# Patient Record
Sex: Female | Born: 1950 | Race: White | Hispanic: No | Smoking: Current every day smoker
Health system: Southern US, Community
[De-identification: ages and names within clinical notes are randomized; demographics above are authoritative.]

## PROBLEM LIST (undated history)

## (undated) DIAGNOSIS — I10 Essential (primary) hypertension: Secondary | ICD-10-CM

## (undated) DIAGNOSIS — R634 Abnormal weight loss: Secondary | ICD-10-CM

## (undated) DIAGNOSIS — Z720 Tobacco use: Secondary | ICD-10-CM

## (undated) DIAGNOSIS — I341 Nonrheumatic mitral (valve) prolapse: Secondary | ICD-10-CM

## (undated) DIAGNOSIS — E559 Vitamin D deficiency, unspecified: Secondary | ICD-10-CM

## (undated) DIAGNOSIS — E039 Hypothyroidism, unspecified: Secondary | ICD-10-CM

## (undated) DIAGNOSIS — E785 Hyperlipidemia, unspecified: Secondary | ICD-10-CM

## (undated) DIAGNOSIS — K859 Acute pancreatitis without necrosis or infection, unspecified: Secondary | ICD-10-CM

## (undated) DIAGNOSIS — M797 Fibromyalgia: Secondary | ICD-10-CM

## (undated) DIAGNOSIS — Z716 Tobacco abuse counseling: Secondary | ICD-10-CM

## (undated) HISTORY — DX: Abnormal weight loss: R63.4

## (undated) HISTORY — DX: Tobacco use: Z72.0

## (undated) HISTORY — DX: Essential (primary) hypertension: I10

## (undated) HISTORY — DX: Hyperlipidemia, unspecified: E78.5

## (undated) HISTORY — PX: ABDOMINAL HYSTERECTOMY: SHX81

## (undated) HISTORY — DX: Vitamin D deficiency, unspecified: E55.9

## (undated) HISTORY — PX: LUMBAR DISC SURGERY: SHX700

## (undated) HISTORY — DX: Acute pancreatitis without necrosis or infection, unspecified: K85.90

## (undated) HISTORY — DX: Hypothyroidism, unspecified: E03.9

## (undated) HISTORY — DX: Nonrheumatic mitral (valve) prolapse: I34.1

## (undated) HISTORY — DX: Fibromyalgia: M79.7

## (undated) HISTORY — DX: Tobacco abuse counseling: Z71.6

---

## 1997-05-21 ENCOUNTER — Inpatient Hospital Stay (HOSPITAL_COMMUNITY): Admission: RE | Admit: 1997-05-21 | Discharge: 1997-05-25 | Payer: Self-pay | Admitting: Neurological Surgery

## 1997-07-02 ENCOUNTER — Emergency Department (HOSPITAL_COMMUNITY): Admission: EM | Admit: 1997-07-02 | Discharge: 1997-07-02 | Payer: Self-pay | Admitting: Emergency Medicine

## 1997-09-24 ENCOUNTER — Emergency Department (HOSPITAL_COMMUNITY): Admission: EM | Admit: 1997-09-24 | Discharge: 1997-09-24 | Payer: Self-pay | Admitting: Emergency Medicine

## 1998-01-04 ENCOUNTER — Emergency Department (HOSPITAL_COMMUNITY): Admission: EM | Admit: 1998-01-04 | Discharge: 1998-01-04 | Payer: Self-pay | Admitting: Emergency Medicine

## 1998-08-26 ENCOUNTER — Other Ambulatory Visit: Admission: RE | Admit: 1998-08-26 | Discharge: 1998-08-26 | Payer: Self-pay | Admitting: Family Medicine

## 2000-07-13 ENCOUNTER — Encounter: Payer: Self-pay | Admitting: Emergency Medicine

## 2000-07-13 ENCOUNTER — Inpatient Hospital Stay (HOSPITAL_COMMUNITY): Admission: EM | Admit: 2000-07-13 | Discharge: 2000-07-14 | Payer: Self-pay | Admitting: Emergency Medicine

## 2001-02-17 ENCOUNTER — Emergency Department (HOSPITAL_COMMUNITY): Admission: EM | Admit: 2001-02-17 | Discharge: 2001-02-17 | Payer: Self-pay | Admitting: Emergency Medicine

## 2001-09-02 ENCOUNTER — Emergency Department (HOSPITAL_COMMUNITY): Admission: EM | Admit: 2001-09-02 | Discharge: 2001-09-03 | Payer: Self-pay | Admitting: Emergency Medicine

## 2001-09-03 ENCOUNTER — Encounter: Payer: Self-pay | Admitting: Emergency Medicine

## 2002-05-06 ENCOUNTER — Emergency Department (HOSPITAL_COMMUNITY): Admission: EM | Admit: 2002-05-06 | Discharge: 2002-05-06 | Payer: Self-pay | Admitting: Emergency Medicine

## 2002-09-22 ENCOUNTER — Encounter: Admission: RE | Admit: 2002-09-22 | Discharge: 2002-09-22 | Payer: Self-pay | Admitting: Emergency Medicine

## 2002-09-22 ENCOUNTER — Encounter: Payer: Self-pay | Admitting: Emergency Medicine

## 2002-10-13 ENCOUNTER — Encounter: Admission: RE | Admit: 2002-10-13 | Discharge: 2002-10-13 | Payer: Self-pay | Admitting: Emergency Medicine

## 2002-10-13 ENCOUNTER — Encounter: Payer: Self-pay | Admitting: Radiology

## 2002-10-13 ENCOUNTER — Encounter: Payer: Self-pay | Admitting: Emergency Medicine

## 2002-10-27 ENCOUNTER — Encounter: Payer: Self-pay | Admitting: Emergency Medicine

## 2002-10-27 ENCOUNTER — Encounter: Admission: RE | Admit: 2002-10-27 | Discharge: 2002-10-27 | Payer: Self-pay | Admitting: Emergency Medicine

## 2002-11-25 ENCOUNTER — Encounter: Admission: RE | Admit: 2002-11-25 | Discharge: 2002-11-25 | Payer: Self-pay | Admitting: Neurological Surgery

## 2002-12-02 ENCOUNTER — Encounter: Admission: RE | Admit: 2002-12-02 | Discharge: 2002-12-02 | Payer: Self-pay | Admitting: Neurological Surgery

## 2003-02-09 ENCOUNTER — Inpatient Hospital Stay (HOSPITAL_COMMUNITY): Admission: RE | Admit: 2003-02-09 | Discharge: 2003-02-13 | Payer: Self-pay | Admitting: Neurological Surgery

## 2003-02-20 ENCOUNTER — Emergency Department (HOSPITAL_COMMUNITY): Admission: EM | Admit: 2003-02-20 | Discharge: 2003-02-21 | Payer: Self-pay | Admitting: Emergency Medicine

## 2003-07-20 ENCOUNTER — Emergency Department (HOSPITAL_COMMUNITY): Admission: EM | Admit: 2003-07-20 | Discharge: 2003-07-20 | Payer: Self-pay | Admitting: *Deleted

## 2005-05-31 ENCOUNTER — Encounter: Payer: Self-pay | Admitting: Emergency Medicine

## 2006-05-05 ENCOUNTER — Encounter: Admission: RE | Admit: 2006-05-05 | Discharge: 2006-05-05 | Payer: Self-pay | Admitting: Emergency Medicine

## 2006-05-18 ENCOUNTER — Encounter: Admission: RE | Admit: 2006-05-18 | Discharge: 2006-05-18 | Payer: Self-pay | Admitting: Emergency Medicine

## 2007-12-24 ENCOUNTER — Emergency Department (HOSPITAL_COMMUNITY): Admission: EM | Admit: 2007-12-24 | Discharge: 2007-12-24 | Payer: Self-pay | Admitting: Emergency Medicine

## 2009-04-26 ENCOUNTER — Ambulatory Visit: Payer: Self-pay | Admitting: Cardiovascular Disease

## 2009-04-26 ENCOUNTER — Inpatient Hospital Stay (HOSPITAL_COMMUNITY): Admission: EM | Admit: 2009-04-26 | Discharge: 2009-04-30 | Payer: Self-pay | Admitting: Emergency Medicine

## 2009-04-30 ENCOUNTER — Encounter (INDEPENDENT_AMBULATORY_CARE_PROVIDER_SITE_OTHER): Payer: Self-pay | Admitting: Internal Medicine

## 2009-08-09 ENCOUNTER — Emergency Department (HOSPITAL_COMMUNITY): Admission: EM | Admit: 2009-08-09 | Discharge: 2009-08-09 | Payer: Self-pay | Admitting: Family Medicine

## 2009-11-10 ENCOUNTER — Emergency Department (HOSPITAL_COMMUNITY): Admission: EM | Admit: 2009-11-10 | Discharge: 2009-11-10 | Payer: Self-pay | Admitting: Emergency Medicine

## 2010-02-20 ENCOUNTER — Encounter: Payer: Self-pay | Admitting: Emergency Medicine

## 2010-04-13 LAB — DIFFERENTIAL
Basophils Absolute: 0 10*3/uL (ref 0.0–0.1)
Lymphocytes Relative: 25 % (ref 12–46)
Monocytes Absolute: 0.9 10*3/uL (ref 0.1–1.0)
Neutro Abs: 5.9 10*3/uL (ref 1.7–7.7)

## 2010-04-13 LAB — BASIC METABOLIC PANEL
BUN: 5 mg/dL — ABNORMAL LOW (ref 6–23)
GFR calc Af Amer: >60 mL/min (ref >60)
GFR calc non Af Amer: >60 mL/min (ref >60)
Potassium: 3 mEq/L — ABNORMAL LOW (ref 3.5–5.1)
Sodium: 139 mEq/L (ref 135–145)

## 2010-04-13 LAB — POCT CARDIAC MARKERS: Myoglobin, poc: 43 ng/mL (ref 12–200)

## 2010-04-13 LAB — D-DIMER, QUANTITATIVE: D-Dimer, Quant: 0.59 ug/mL-FEU — ABNORMAL HIGH (ref 0.00–0.48)

## 2010-04-13 LAB — CBC
HCT: 39.3 % (ref 36.0–46.0)
Platelets: 223 10*3/uL (ref 150–400)
RBC: 4.4 MIL/uL (ref 3.87–5.11)
RDW: 14.4 % (ref 11.5–15.5)
WBC: 9.2 10*3/uL (ref 4.0–10.5)

## 2010-04-20 LAB — COMPREHENSIVE METABOLIC PANEL
CO2: 18 mEq/L — ABNORMAL LOW (ref 19–32)
Calcium: 9.9 mg/dL (ref 8.4–10.5)
Creatinine, Ser: 1.33 mg/dL — ABNORMAL HIGH (ref 0.4–1.2)
GFR calc non Af Amer: 41 mL/min — ABNORMAL LOW (ref >60)
Glucose, Bld: 72 mg/dL (ref 70–99)

## 2010-04-24 LAB — COMPREHENSIVE METABOLIC PANEL
AST: 43 U/L — ABNORMAL HIGH (ref 0–37)
Albumin: 3.3 g/dL — ABNORMAL LOW (ref 3.5–5.2)
Alkaline Phosphatase: 40 U/L (ref 39–117)
BUN: 19 mg/dL (ref 6–23)
BUN: 27 mg/dL — ABNORMAL HIGH (ref 6–23)
Calcium: 14.3 mg/dL (ref 8.4–10.5)
Chloride: 113 mEq/L — ABNORMAL HIGH (ref 96–112)
Creatinine, Ser: 1.55 mg/dL — ABNORMAL HIGH (ref 0.4–1.2)
Creatinine, Ser: 1.7 mg/dL — ABNORMAL HIGH (ref 0.4–1.2)
GFR calc Af Amer: 37 mL/min — ABNORMAL LOW (ref >60)
Glucose, Bld: 88 mg/dL (ref 70–99)
Potassium: 3.1 mEq/L — ABNORMAL LOW (ref 3.5–5.1)
Total Bilirubin: 0.5 mg/dL (ref 0.3–1.2)

## 2010-04-24 LAB — URINE MICROSCOPIC-ADD ON

## 2010-04-24 LAB — URINALYSIS, ROUTINE W REFLEX MICROSCOPIC
Glucose, UA: NEGATIVE mg/dL
Hgb urine dipstick: NEGATIVE
Ketones, ur: NEGATIVE mg/dL
Protein, ur: NEGATIVE mg/dL

## 2010-04-24 LAB — CARDIAC PANEL(CRET KIN+CKTOT+MB+TROPI)
CK, MB: 3.9 ng/mL (ref 0.3–4.0)
Relative Index: 3.1 — ABNORMAL HIGH (ref 0.0–2.5)
Total CK: 127 U/L (ref 7–177)
Total CK: 232 U/L — ABNORMAL HIGH (ref 7–177)
Troponin I: 0.01 ng/mL (ref 0.00–0.06)
Troponin I: 0.02 ng/mL (ref 0.00–0.06)

## 2010-04-24 LAB — BASIC METABOLIC PANEL
BUN: 23 mg/dL (ref 6–23)
Calcium: 10.3 mg/dL (ref 8.4–10.5)
Calcium: 12.8 mg/dL — ABNORMAL HIGH (ref 8.4–10.5)
Chloride: 101 mEq/L (ref 96–112)
Creatinine, Ser: 1.44 mg/dL — ABNORMAL HIGH (ref 0.4–1.2)
Creatinine, Ser: 1.59 mg/dL — ABNORMAL HIGH (ref 0.4–1.2)
GFR calc Af Amer: 40 mL/min — ABNORMAL LOW (ref >60)
GFR calc Af Amer: 43 mL/min — ABNORMAL LOW (ref >60)
GFR calc Af Amer: 45 mL/min — ABNORMAL LOW (ref >60)
GFR calc non Af Amer: 33 mL/min — ABNORMAL LOW (ref >60)
GFR calc non Af Amer: 35 mL/min — ABNORMAL LOW (ref >60)
GFR calc non Af Amer: 37 mL/min — ABNORMAL LOW (ref >60)
Potassium: 2.7 mEq/L — CL (ref 3.5–5.1)
Potassium: 3.1 mEq/L — ABNORMAL LOW (ref 3.5–5.1)
Sodium: 137 mEq/L (ref 135–145)

## 2010-04-24 LAB — URINALYSIS, MICROSCOPIC ONLY
Bilirubin Urine: NEGATIVE
Hgb urine dipstick: NEGATIVE
Ketones, ur: NEGATIVE mg/dL
Nitrite: NEGATIVE
Specific Gravity, Urine: 1.003 — ABNORMAL LOW (ref 1.005–1.030)
pH: 6.5 (ref 5.0–8.0)

## 2010-04-24 LAB — CBC
HCT: 37.8 % (ref 36.0–46.0)
Hemoglobin: 11.1 g/dL — ABNORMAL LOW (ref 12.0–15.0)
Hemoglobin: 13.1 g/dL (ref 12.0–15.0)
MCHC: 34.2 g/dL (ref 30.0–36.0)
MCHC: 34.6 g/dL (ref 30.0–36.0)
MCV: 93.3 fL (ref 78.0–100.0)
Platelets: 209 10*3/uL (ref 150–400)
RDW: 12.7 % (ref 11.5–15.5)
RDW: 12.8 % (ref 11.5–15.5)

## 2010-04-24 LAB — HEPATIC FUNCTION PANEL
ALT: 20 U/L (ref 0–35)
AST: 37 U/L (ref 0–37)
Alkaline Phosphatase: 41 U/L (ref 39–117)
Bilirubin, Direct: <0.1 mg/dL (ref 0.0–0.3)
Total Protein: 5.8 g/dL — ABNORMAL LOW (ref 6.0–8.3)

## 2010-04-24 LAB — CK TOTAL AND CKMB (NOT AT ARMC)
CK, MB: 7.3 ng/mL (ref 0.3–4.0)
Relative Index: 2.6 — ABNORMAL HIGH (ref 0.0–2.5)

## 2010-04-24 LAB — LIPID PANEL
LDL Cholesterol: 64 mg/dL (ref 0–99)
Triglycerides: 118 mg/dL (ref <150)
VLDL: 24 mg/dL (ref 0–40)

## 2010-04-24 LAB — CULTURE, BLOOD (ROUTINE X 2)

## 2010-04-24 LAB — UIFE/LIGHT CHAINS/TP QN, 24-HR UR
Free Kappa Lt Chains,Ur: 1.2 mg/dL (ref 0.04–1.51)
Free Lambda Lt Chains,Ur: 0.11 mg/dL (ref 0.08–1.01)
Gamma Globulin, Urine: DETECTED — AB

## 2010-04-24 LAB — POCT I-STAT, CHEM 8
BUN: 31 mg/dL — ABNORMAL HIGH (ref 6–23)
Chloride: 97 mEq/L (ref 96–112)
Creatinine, Ser: 1.9 mg/dL — ABNORMAL HIGH (ref 0.4–1.2)
Potassium: 2 mEq/L — CL (ref 3.5–5.1)
Sodium: 134 mEq/L — ABNORMAL LOW (ref 135–145)

## 2010-04-24 LAB — DIFFERENTIAL
Basophils Absolute: 0 10*3/uL (ref 0.0–0.1)
Basophils Relative: 0 % (ref 0–1)
Eosinophils Absolute: 0.1 10*3/uL (ref 0.0–0.7)
Eosinophils Relative: 1 % (ref 0–5)
Monocytes Absolute: 1 10*3/uL (ref 0.1–1.0)
Neutro Abs: 8.3 10*3/uL — ABNORMAL HIGH (ref 1.7–7.7)

## 2010-04-24 LAB — MAGNESIUM
Magnesium: 1.2 mg/dL — ABNORMAL LOW (ref 1.5–2.5)
Magnesium: 1.3 mg/dL — ABNORMAL LOW (ref 1.5–2.5)

## 2010-04-24 LAB — CREATININE, URINE, RANDOM: Creatinine, Urine: 20.4 mg/dL

## 2010-04-24 LAB — LIPASE, BLOOD
Lipase: 297 U/L — ABNORMAL HIGH (ref 11–59)
Lipase: 54 U/L (ref 11–59)

## 2010-04-24 LAB — PTH-RELATED PEPTIDE

## 2010-04-24 LAB — PROTEIN ELECTROPH W RFLX QUANT IMMUNOGLOBULINS
Albumin ELP: 53.6 % — ABNORMAL LOW (ref 55.8–66.1)
Alpha-1-Globulin: 6.8 % — ABNORMAL HIGH (ref 2.9–4.9)
Beta Globulin: 6.3 % (ref 4.7–7.2)
Total Protein ELP: 6.1 g/dL (ref 6.0–8.3)

## 2010-04-24 LAB — MRSA PCR SCREENING: MRSA by PCR: NEGATIVE

## 2010-04-24 LAB — URINE CULTURE: Culture: NO GROWTH

## 2010-04-24 LAB — TROPONIN I: Troponin I: 0.03 ng/mL (ref 0.00–0.06)

## 2010-04-24 LAB — PTH, INTACT AND CALCIUM: PTH: 7.1 pg/mL — ABNORMAL LOW (ref 14.0–72.0)

## 2010-04-24 LAB — POTASSIUM: Potassium: 2.2 mEq/L — CL (ref 3.5–5.1)

## 2010-04-24 LAB — CALCIUM: Calcium: 14.3 mg/dL (ref 8.4–10.5)

## 2010-04-29 DIAGNOSIS — M546 Pain in thoracic spine: Secondary | ICD-10-CM

## 2010-04-29 DIAGNOSIS — M791 Myalgia, unspecified site: Secondary | ICD-10-CM

## 2010-04-29 HISTORY — DX: Myalgia, unspecified site: M79.10

## 2010-04-29 HISTORY — DX: Pain in thoracic spine: M54.6

## 2010-06-17 NOTE — Op Note (Signed)
Beth Blake, Beth Blake                            ACCOUNT NO.:  [id number]   MEDICAL RECORD NO.:  [id number]                   PATIENT TYPE:  INP   LOCATION:  2899                                 FACILITY:  [facility]   PHYSICIAN:  Stefani Dama, M.D.               DATE OF BIRTH:  10-16-1950   DATE OF PROCEDURE:  02/09/2003  DATE OF DISCHARGE:                                 OPERATIVE REPORT   PREOPERATIVE DIAGNOSIS:  Lumbar spondylosis and stenosis, L3-L4, status post  lumbar  arthrodesis L4 to sacrum. Lumbar radiculopathy.   POSTOPERATIVE DIAGNOSIS:  Lumbar spondylosis and stenosis, L3-L4, status  post lumbar  arthrodesis L4 to sacrum. Lumbar radiculopathy.   PROCEDURE:  1. Laminectomy L3-L4 bilaterally.  2. Posterior lumbar  interbody arthrodesis L3-4 with PEAK Synthes interbody     spacers, local autograft and allograft.  3. Posterolateral arthrodesis  with local autograft  and allograft     nonsegmental fixation with pedicle screws L3-L4.   SURGEON:  Stefani Dama, M.D.   FIRST ASSISTANT:  Hewitt Shorts, M.D.   ANESTHESIA:  General endotracheal anesthesia.   INDICATIONS FOR PROCEDURE:  The patient is a 60 year old individual who has  had significant difficulties with her back in the past. She had  initially  undergone decompression and arthrodesis  at L4-L5. She recovered well and  was able to return to work. A few years later she developed problems at L5-  S1 and underwent a Ray cage arthrodesis and recovered well from  that  procedure. She was able to return to work. The patient has done well for  the past 5 or 6 years and now presents with new symptoms  of back pain,  bilateral leg pain with evidence of a spondylitic stenosis at the level of  L3-4 above her previous surgeries. She has been advised regarding surgical  decompression and stabilization.   DESCRIPTION OF PROCEDURE:  The patient was brought to the operating room  supine on the stretcher. After the smooth  induction of general endotracheal  anesthesia she was turned prone. Her back was shaved and prepped with  Duraprep and draped in a sterile fashion.   A midline incision was made in the lumbar spine and carried up just above  the end of her old incision. Dissection was then taken down to the  lumbodorsal fascia which was opened on either  side of the midline. The  intralaminar space at L3-4 was identified by identifying the spinous  processes of L3 positively with a radiograph. The intralaminar space was  then cleared bilaterally at L3-4. The dissection was taken out over the  facet joints which were noted to be significantly hypertrophied at L3-4.   The L4 transverse process was identified and superiorly the L3 transverse  process was identified. These lateral gutters were then packed off for later  use. A laminectomy was then created removing the inferior  margin of the  lamina of L3 including  the entirety of the facet joint. The spinous process  and superior portion of the laminar arch was left intact.   The common dural tube was decompressed. There was a significant amount of  hypertrophied ligamentum and overgrown bone. With this being taken down, the  common dural tube was decompressed as was the L4 nerve root inferiorly.  Superiorly the L3 nerve roots were decompressed.   The disk space was identified and this was isolated. The disk was noted to  be very soft and bulging posteriorly. This was first done on the right side  and then on the left. Then from the right  side a diskectomy was performed  and a significant quantity of severely degenerated disk material was removed  after opening the posterior longitudinal ligament  with a #15 blade.   Once this area was decompressed, a series of curets and rongeurs were used  to evacuate the remnants of the significantly degenerated disk. A self-  retaining disk spreader was placed in the wound and a diskectomy was then  performed on  the opposite side with the distraction of the disk space. A  series of curets and rongeurs was passed down this side and the disk was  removed to the region of the anterior longitudinal ligament. The lateral  gutters were decompressed in a similar fashion.   Then having removed all the disk, the endplates were prepared by curettage  and using a raspatory. They were sized then for an 11-mm spacer. An 11-mm  PEAK spacer was then packed with the patient's autologous bone and some  allograft bone which was mixed with a platelet gel  concentrate. This was  packed into  the PEAK spacers and then the PEAK spacers were inserted after  placing bone graft into the disk space and packing it anteriorly  and  laterally. First this was done on the right and then on the left.   The pedicle entry sites were then chosen at L3 and L4. These were checked  radiographically, and then four 6.2 x 40 mm screws were placed into each of  the holes after tapping them  sequentially and then checking for cutout. No  cutout was found. The screws thus being placed, the lateral gutters that had  been previously packed off were then packed with the patient's cancellous  bone and the bone mixed with platelet gel.   The screw caps were applied and the system was locked into position  in the  neutral position, not applying any compression or distraction forces. A  final localizing radiograph was obtained and this identified  good position  of the surgical construct. The wound was copiously irrigated with antibiotic  irrigating solution and the lumbodorsal fascia was closed with #1 Vicryl in  an interrupted fashion. Then 2-0 Vicryl was used in the subcutaneous tissues  and 3-0 Vicryl subcuticularly.   The patient tolerated the procedure well. She was returned to the recovery  room in stable condition. Blood loss for the case was estimated at 200 mL.  No Cellsaver blood was prepared.                                               Stefani Dama, M.D.    Merla Riches  D:  02/09/2003  T:  02/09/2003  Job:  [id number] 

## 2010-06-17 NOTE — Discharge Summary (Signed)
Beth Blake, Beth Blake                            ACCOUNT NO.:  [id number]   MEDICAL RECORD NO.:  [id number]                   PATIENT TYPE:  INP   LOCATION:  3018                                 FACILITY:  [facility]   PHYSICIAN:  Stefani Dama, M.D.               DATE OF BIRTH:  1950-12-27   DATE OF ADMISSION:  02/09/2003  DATE OF DISCHARGE:  02/13/2003                                 DISCHARGE SUMMARY   ADMISSION DIAGNOSIS:  Lumbar spondylosis and stenosis, L3-4, with lumbar  radiculopathy.   DISCHARGE DIAGNOSIS:  1. Lumbar spondylosis and stenosis, L3-4, with lumbar radiculopathy.  2  Paralytic ileus postoperatively.  1. Urinary tract infection.   HOSPITAL COURSE:  [name] is a 61 year old individual who has had a  previous decompression and fusion at L4-5.  She had developed increasing  back pain with lower lumbar radiculopathy and was found to have severe  spondylitic stenosis at L3-4, a junctional syndrome.  She was advised  regarding surgical decompression and stabilization and was admitted to the  hospital on February 09, 2003 for this procedure.  She tolerated the surgery  well and was gradually mobilized; however, she had difficulty with  persistent nausea secondary to a paralytic ileus.  This gradually resolved  with the passage of time, decreasing her pain medication.  She is also noted  to have a urinary tract infection with thick, putrid sediment in her urine.  She was started on some oral antibiotics, and this gradually resolved.  She  is mobilized and ambulatory by the fourth postoperative day and was  discharged home.  She will be seen in the office in two weeks time for  further followup.                                                Stefani Dama, M.D.    Beth Blake  D:  02/26/2003  T:  02/27/2003  Job:  [id number]

## 2010-11-01 LAB — BASIC METABOLIC PANEL
BUN: 13
CO2: 27
Chloride: 105
Glucose, Bld: 103 — ABNORMAL HIGH
Potassium: 3.5

## 2010-11-01 LAB — CBC
HCT: 38.1
MCV: 90.9
Platelets: 248
WBC: 7.4

## 2010-11-01 LAB — DIFFERENTIAL
Eosinophils Absolute: 0.2
Eosinophils Relative: 3
Lymphs Abs: 2.8

## 2010-11-01 LAB — T4, FREE: Free T4: 1.41

## 2010-11-11 DIAGNOSIS — M797 Fibromyalgia: Secondary | ICD-10-CM | POA: Insufficient documentation

## 2011-04-05 IMAGING — NM NM LIVER FUNCTION STUDY
2 series · 7 of 7 positions shown · non-contrast
Comparison: CT abdomen 04/26/2009.

CLINICAL DATA: 59-year-old female with gallstones, abdominal pain
and nausea.

NUCLEAR MEDICINE HEPATOBILIARY IMAGING
TECHNIQUE: Sequential images of the abdomen were obtained [DATE] minutes following intravenous administration of
radiopharmaceutical.
Radiopharmaceutical:  5 mCi 1c-99m Choletec

[he hepatobiliary · 3.43mm/px · 6 of 39 frames shown (1 of 2)]
[frame 4/39]
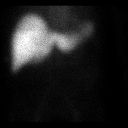
[frame 10/39]
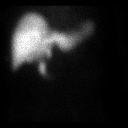
[frame 17/39]
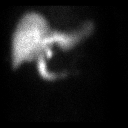
[frame 23/39]
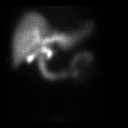
[frame 30/39]
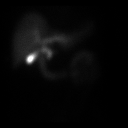
[frame 36/39]
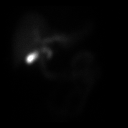

[he hepatobiliary · 1 of 1 slices shown (2 of 2)]
[im 1/1]
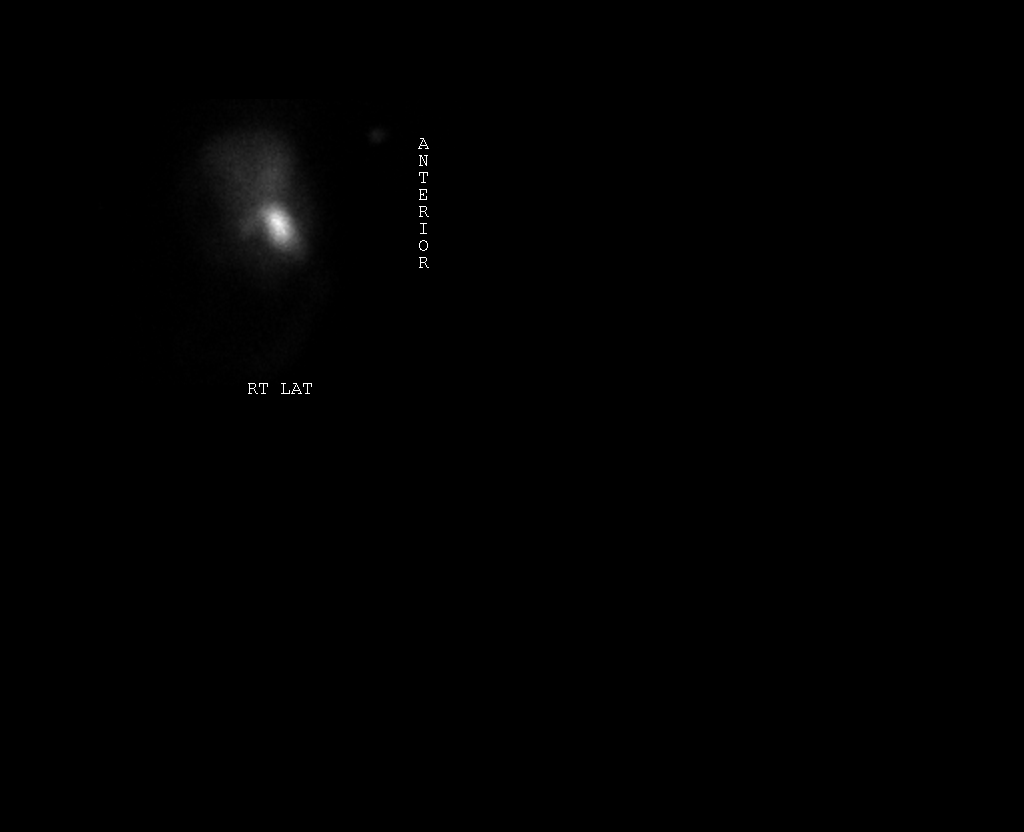

[7 of 7 positions shown; findings below may reference images not displayed]

FINDINGS: Normal radiotracer uptake by the liver and clearance of
the blood pool.  Prompt appearance of the CBD and small bowel by 15
minutes.  Gallbladder is first evident on the 25-minute exam with
increasing gallbladder activity to the conclusion of the study.
Radiotracer activity washes out of the liver and into the small
bowel.
IMPRESSION: Negative.    No evidence of acute cholecystitis.

## 2011-08-07 DIAGNOSIS — E785 Hyperlipidemia, unspecified: Secondary | ICD-10-CM

## 2011-08-07 HISTORY — DX: Hyperlipidemia, unspecified: E78.5

## 2011-10-19 IMAGING — CT CT ANGIO CHEST
1 of 13 series · 12 of 37 positions shown · IV contrast (APPLIED)
Comparison: Noncontrast abdominal pelvic CT 04/26/2009.

CTA CHEST

CLINICAL DATA: Chest pain with elevated D-dimer levels for 2 days.
History of hypertension.

CT ANGIOGRAPHY CHEST
CT ABDOMEN WITH CONTRAST
TECHNIQUE: Multidetector CT imaging of the chest was performed
using the standard protocol during bolus administration of
intravenous contrast.  Multiplanar CT image reconstructions
including MIPs were obtained to evaluate the vascular anatomy.
Multidetector CT imaging of the abdomen and pelvis was performed
intravenous contrast.
Contrast: 100 ml 4mnipaque-JFF intravenously.

[Series 8: pulm embolism 1.0 b25f thins · axial · 0.71mm/px · z∈[+252,+492]mm · 12 of 285 slices shown]
[im 22/285  lung]
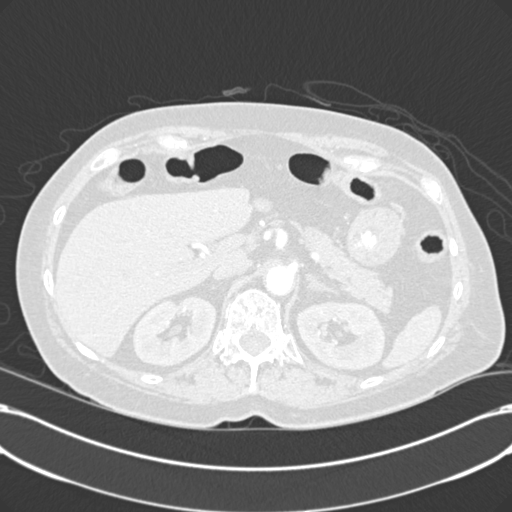
[im 44/285  mediastinal]
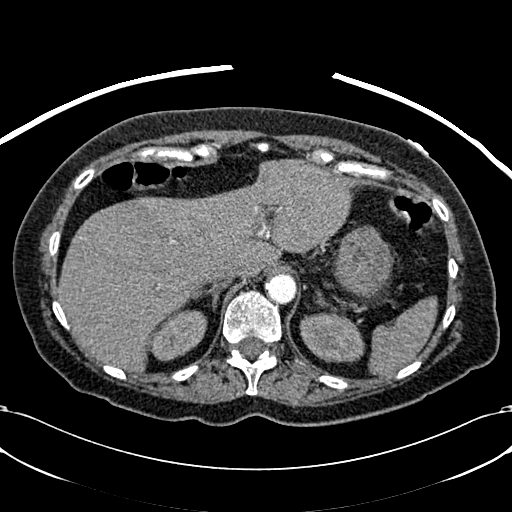
[im 66/285  lung]
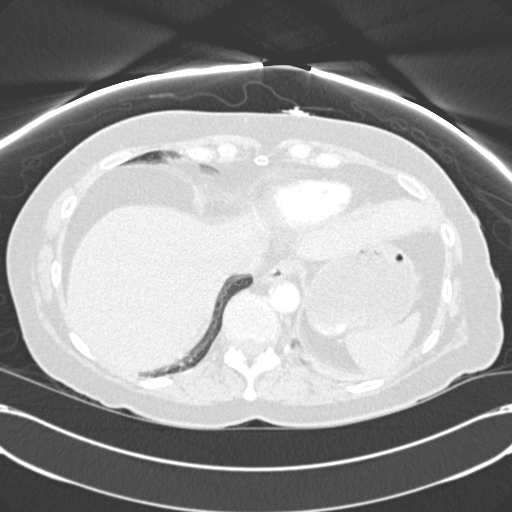
[im 88/285  mediastinal]
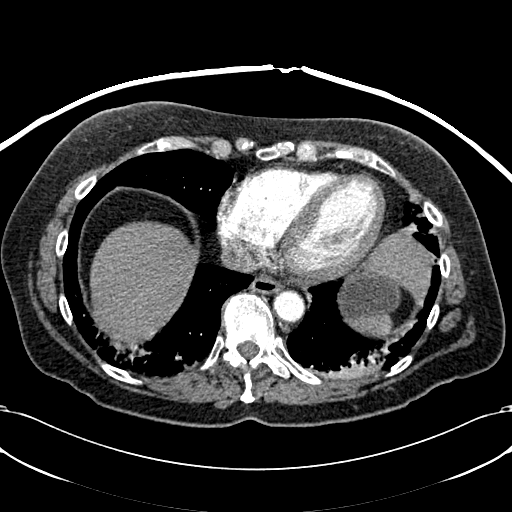
[im 110/285  lung]
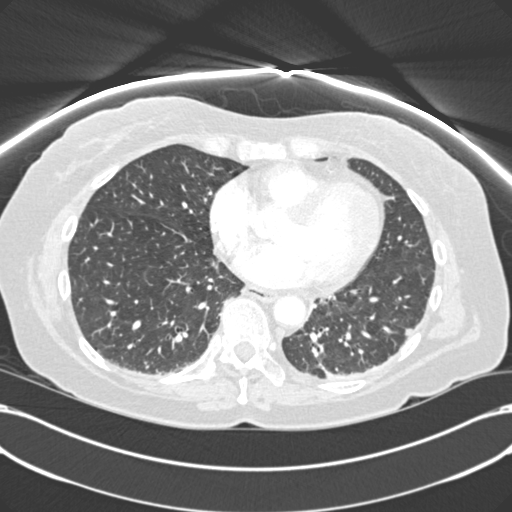
[im 132/285  mediastinal]
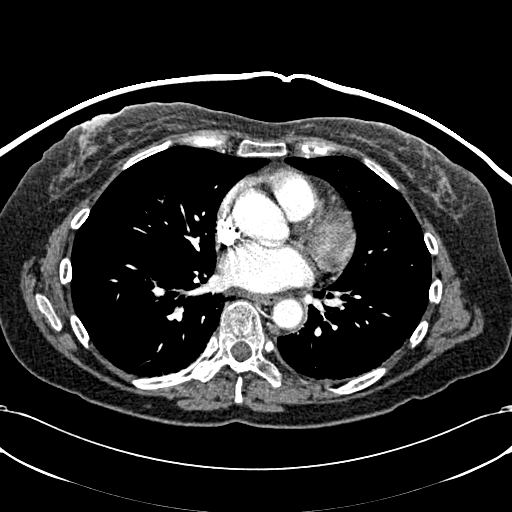
[im 153/285  lung]
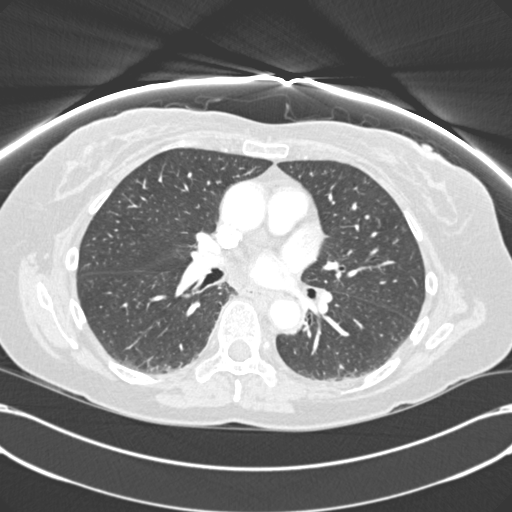
[im 175/285  mediastinal]
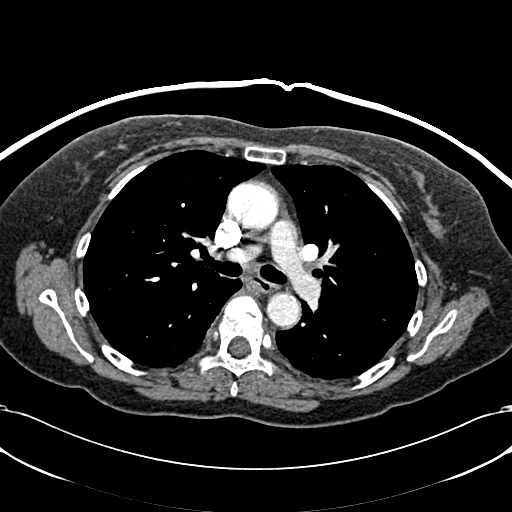
[im 197/285  lung]
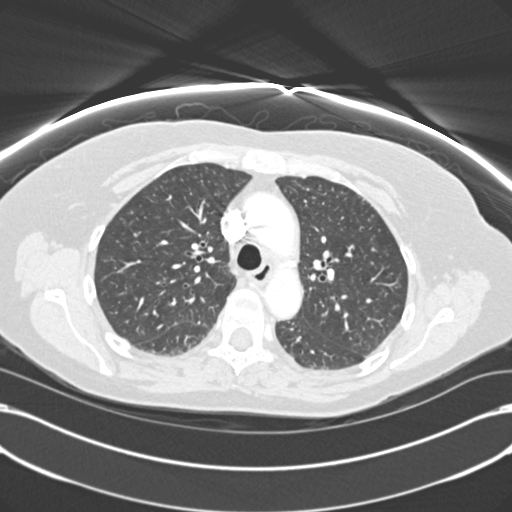
[im 219/285  mediastinal]
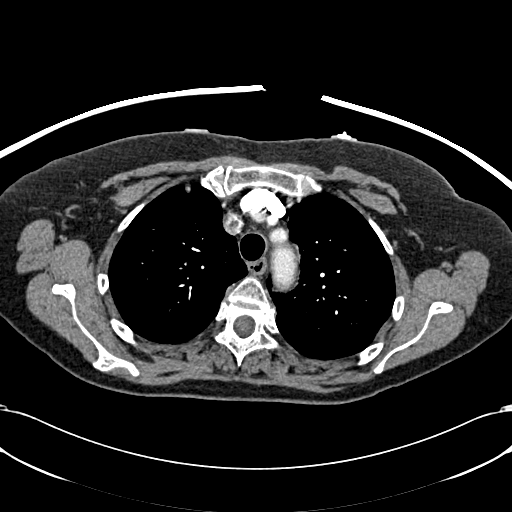
[im 241/285  lung]
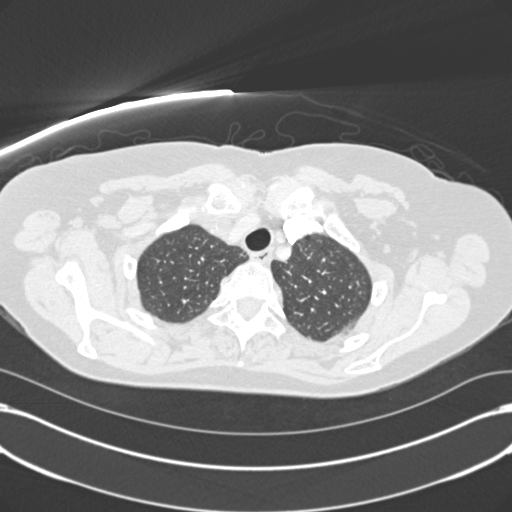
[im 263/285  mediastinal]
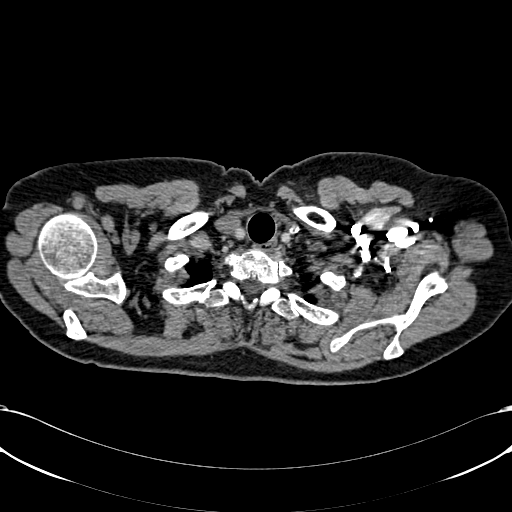

[12 of 37 positions shown; findings below may reference images not displayed]

FINDINGS: The pulmonary arteries are well opacified with contrast.
There is no evidence of acute pulmonary embolism.  The thoracic
aorta demonstrates mild atherosclerosis.  There is no evidence of
aneurysm or dissection.  There is a small hiatal hernia.  No
enlarged mediastinal or hilar lymph nodes are present.

There is no pleural or pericardial effusion.  Dependent opacities
in both lung bases are most consistent with atelectasis.  There is
no confluent airspace opacity.  Small nodular densities are present
in both upper lobes, measuring less than 3 mm in diameter (images
25 and 27).

There are superior endplate compression deformities at T3, T4 and
T6.  There is possibly a small amount of anterior paraspinal
hemorrhage at these levels, and these fractures could be
acute/subacute.  Correlate clinically.

 Review of the MIP images confirms the above findings.
IMPRESSION: 1.  No evidence of acute pulmonary embolism.
2.  Mild atherosclerosis without evidence of dissection or
aneurysm.
3.  Bibasilar atelectasis.  Tiny upper lobe nodules unlikely to be
clinically significant.
4.  Superior endplate compression deformities at T3, T4 and T6,
potentially acute/ subacute.  Correlate clinically.

CT ABDOMEN
FINDINGS: The liver, spleen and pancreas appear normal.  There is a
probable small gallstone or gallbladder polyp, unchanged.  There is
no biliary dilatation.

The adrenal glands and kidneys appear normal.

There is fairly extensive aorto iliac atherosclerosis.  No large
vessel occlusion is identified.  There is no adenopathy.  The
abdominal bowel gas pattern is normal with moderate stool in the
right colon.  There are stable postsurgical changes in the lower
lumbar spine status post L3-S1 fusion.  The pelvis was not imaged.
IMPRESSION: 1.  No acute abdominal findings.
2.  Stable small gallstone or small polyp.
3.  Postsurgical changes in the lower lumbar spine.
4.  Diffuse abdominal aortic atherosclerosis.

## 2012-02-12 DIAGNOSIS — E039 Hypothyroidism, unspecified: Secondary | ICD-10-CM | POA: Insufficient documentation

## 2012-02-12 DIAGNOSIS — S22009A Unspecified fracture of unspecified thoracic vertebra, initial encounter for closed fracture: Secondary | ICD-10-CM

## 2012-02-12 DIAGNOSIS — I10 Essential (primary) hypertension: Secondary | ICD-10-CM | POA: Insufficient documentation

## 2012-02-12 HISTORY — DX: Unspecified fracture of unspecified thoracic vertebra, initial encounter for closed fracture: S22.009A

## 2012-02-12 HISTORY — DX: Essential (primary) hypertension: I10

## 2012-03-25 DIAGNOSIS — M47812 Spondylosis without myelopathy or radiculopathy, cervical region: Secondary | ICD-10-CM

## 2012-03-25 HISTORY — DX: Spondylosis without myelopathy or radiculopathy, cervical region: M47.812

## 2013-08-14 DIAGNOSIS — E559 Vitamin D deficiency, unspecified: Secondary | ICD-10-CM

## 2013-08-14 HISTORY — DX: Vitamin D deficiency, unspecified: E55.9

## 2014-04-06 DIAGNOSIS — Z720 Tobacco use: Secondary | ICD-10-CM | POA: Insufficient documentation

## 2014-04-15 ENCOUNTER — Encounter: Payer: Self-pay | Admitting: Nurse Practitioner

## 2014-04-29 ENCOUNTER — Encounter: Payer: Self-pay | Admitting: *Deleted

## 2014-04-30 ENCOUNTER — Ambulatory Visit (INDEPENDENT_AMBULATORY_CARE_PROVIDER_SITE_OTHER): Payer: Medicare HMO | Admitting: Nurse Practitioner

## 2014-04-30 ENCOUNTER — Encounter: Payer: Self-pay | Admitting: Nurse Practitioner

## 2014-04-30 VITALS — BP 128/76 | HR 80 | Ht 65.5 in | Wt 120.5 lb

## 2014-04-30 DIAGNOSIS — R6881 Early satiety: Secondary | ICD-10-CM | POA: Insufficient documentation

## 2014-04-30 DIAGNOSIS — R634 Abnormal weight loss: Secondary | ICD-10-CM

## 2014-04-30 HISTORY — DX: Early satiety: R68.81

## 2014-04-30 NOTE — Patient Instructions (Addendum)
You have been given a separate informational sheet regarding your tobacco use, the importance of quitting and local resources to help you quit.   You have been scheduled for an endoscopy. Please follow written instructions given to you at your visit today. If you use inhalers (even only as needed), please bring them with you on the day of your procedure. Your physician has requested that you go to www.startemmi.com and enter the access code given to you at your visit today. This web site gives a general overview about your procedure. However, you should still follow specific instructions given to you by our office regarding your preparation for the procedure. 

## 2014-04-30 NOTE — Progress Notes (Signed)
Reviewed and agree.

## 2014-04-30 NOTE — Progress Notes (Addendum)
HPI : Patient is a 64 year old female referred by PCP for evaluation of epigastric pain, early satiety and weight loss. CBC, CMET, and sedimentation rate all normal early March. Chest x-ray shows a mild thoracic compression deformity of indeterminate age and changes related to COPD. CT scan of the abdomen and pelvis with contrast done [facility] 04/13/14 reveals gallstones within a normal-appearing gallbladder, punctate noncalcified nodules in the subpleural aspect of the left lower lobe of the lung was seen.   Patient reports that she was 142 pounds in July, now down in the 120s. She has no nausea. Appetite is okay but has significant early satiety. Patient reports epigastric tenderness but no pain.   Past Medical History  Diagnosis Date  . Hypothyroidism   . Hypertension   . Hyperlipemia   . Tobacco abuse counseling   . Vitamin D deficiency disease   . Weight loss   . Tobacco abuse   . Fibromyalgia   . Pancreatitis   . Mitral valve prolapse     Past Surgical History  Procedure Laterality Date  . Abdominal hysterectomy    . Lumbar disc surgery      x 5    Family History  Problem Relation Age of Onset  . Heart disease Father   . Asthma Sister   . Breast cancer Mother   . Breast cancer Sister     x 2  . Emphysema Paternal Grandfather    History  Substance Use Topics  . Smoking status: Current Every Day Smoker -- 1.00 packs/day for 47 years    Types: Cigarettes  . Smokeless tobacco: Never Used  . Alcohol Use: No   Current Outpatient Prescriptions  Medication Sig Dispense Refill  . amLODipine (NORVASC) 10 MG tablet Take 10 mg by mouth daily. Take 1 tab daily    . atenolol (TENORMIN) 25 MG tablet Take by mouth daily.    Marland Kitchen. levothyroxine (SYNTHROID, LEVOTHROID) 125 MCG tablet Take 125 mcg by mouth daily before breakfast. Take 1 tab daily    . simvastatin (ZOCOR) 20 MG tablet Take 20 mg by mouth daily.     No current facility-administered medications for  this visit.   Allergies  Allergen Reactions  . Meloxicam     Edema  . Sulfa Antibiotics     Swelling   Review of Systems: All systems reviewed and negative except where noted in HPI.   Physical Exam: BP 128/76 mmHg  Pulse 80  Ht 5' 5.5" (1.664 m)  Wt 120 lb 8 oz (54.658 kg)  BMI 19.74 kg/m2 Constitutional: Pleasant, thin white female in no acute distress. HEENT: Normocephalic and atraumatic. Conjunctivae are normal. No scleral icterus. Neck supple.  Cardiovascular: Normal rate, regular rhythm.  Pulmonary/chest: Effort normal and breath sounds normal. No wheezing, rales or rhonchi. Abdominal: Soft, nondistended, nontender. Bowel sounds active throughout. There are no masses palpable. No hepatomegaly. Extremities: no edema Lymphadenopathy: No cervical adenopathy noted. Neurological: Alert and oriented to person place and time. Skin: Skin is warm and dry. No rashes noted. Psychiatric: Normal mood and affect. Behavior is normal.   ASSESSMENT AND PLAN:  871. 64 year old female referred by PCP for evaluation of early satiety and weight loss. She is tender in the epigastrium but no abdominal pain. No associated nausea. Workup by PCP including labs, chest x-ray and abd/pel CT scan with contrast have been nondiagnostic. For further evaluation of early satiety / weight loss will proceed with an upper endoscopy. The benefits, risks, and potential  complications of EGD with possible biopsies were discussed with the patient and she agrees to proceed.   2. Colon cancer screening. Patient is a poor historian. She cannot remember when or where her last colonoscopy was but believes that it was done in [place] anywhere from 2-5 years ago. We will try to obtain colonoscopy report.   3. Asymptomatic cholelithiasis  4. Noncalcified nodules -subpleural aspect of LLL on CTscan. Follow up per PCP.   5. Tobacco abuse / COPD / hypothyroidism on replacement therapy. Information on smoking cessation  given.    CC: Tracey Harries, MD  Addendum:  Records received from [facility] recieved.  Screening colonoscopy by Dr. Loreta Ave Feb 2011. Exam included TI intubation. Prep was good. Findings: a couple of sigmoid diverticula, otherwise normal. Next colonoscopy probably Feb 2021, will defer to Dr Juanda Chance.

## 2014-05-04 ENCOUNTER — Encounter: Payer: Medicare HMO | Admitting: Internal Medicine

## 2014-05-08 ENCOUNTER — Encounter: Payer: Self-pay | Admitting: Internal Medicine

## 2014-05-13 ENCOUNTER — Encounter: Payer: Self-pay | Admitting: Internal Medicine

## 2014-05-13 ENCOUNTER — Telehealth: Payer: Self-pay | Admitting: *Deleted

## 2014-05-13 ENCOUNTER — Ambulatory Visit (AMBULATORY_SURGERY_CENTER): Payer: Medicare HMO | Admitting: Internal Medicine

## 2014-05-13 ENCOUNTER — Other Ambulatory Visit: Payer: Self-pay | Admitting: *Deleted

## 2014-05-13 VITALS — BP 148/86 | HR 98 | Temp 97.4°F | Resp 20 | Ht 65.5 in | Wt 120.0 lb

## 2014-05-13 DIAGNOSIS — K208 Other esophagitis: Secondary | ICD-10-CM | POA: Diagnosis not present

## 2014-05-13 DIAGNOSIS — R634 Abnormal weight loss: Secondary | ICD-10-CM

## 2014-05-13 DIAGNOSIS — R6881 Early satiety: Secondary | ICD-10-CM

## 2014-05-13 DIAGNOSIS — K8020 Calculus of gallbladder without cholecystitis without obstruction: Secondary | ICD-10-CM

## 2014-05-13 MED ORDER — SODIUM CHLORIDE 0.9 % IV SOLN: 500.0000 mL | INTRAVENOUS | Status: DC

## 2014-05-13 NOTE — Op Note (Signed)
[facility] [address] [place], [id number]   ENDOSCOPY PROCEDURE REPORT  PATIENT: Beth Blake, Sanela [facility]  MR#: [id number] BIRTHDATE: 1950/05/28 , 64  yrs. old GENDER: female ENDOSCOPIST: Hart Carwinora M Brodie, MD REFERRED BY:  Tracey Harriesavid Bouska, M.D. PROCEDURE DATE:  05/13/2014 PROCEDURE:  EGD w/ biopsy ASA CLASS:     Class II INDICATIONS:  epigastric pain and ,weight loss epigastric pain, COPD.  Normal gallstones on CT scan of the abdomen last fall. MEDICATIONS: Monitored anesthesia care and Propofol 120 mg IV TOPICAL ANESTHETIC: none  DESCRIPTION OF PROCEDURE: After the risks benefits and alternatives of the procedure were thoroughly explained, informed consent was obtained.  The LB [id number] endoscope was introduced through the mouth and advanced to the second portion of the duodenum , Without limitations.  The instrument was slowly withdrawn as the mucosa was fully examined.    Esophagus: proximal, mid and distal esophageal mucosa was normal. Was a sharp GE junction appeared irregular. There was no stricture that was 2-3 cm hiatal hernia without Cameron erosions  Stomach: gastric mucosa was unremarkable with normal-appearing gastric rugal folds. Gastric antrum and outlet were unremarkable. Retroflexion of the endoscope revealed normal fundus and cardia. There was no food retained in the stomach  Duodenum: duodenal bulb and descending duodenum was normal including second and third portion of duodenum. Biopsies were obtained from the third portion duodenum to rule out villous atrophy[         The scope was then withdrawn from the patient and the procedure completed.  COMPLICATIONS: There were no immediate complications.    ENDOSCOPIC IMPRESSION: 1. normal esophagus stomach and duodenum. Nothing to explain weight loss and early satiety 2. Small bowel biopsies to rule out villous atrophy 3. Small reducible hiatal hernia  RECOMMENDATIONS:  1await results  of biopsies scanwith CCK 2. Consider CT angiogram to rule out mesenteric insufficiency 3. Consider colonoscopy for father evaluation of abdominal pain  REPEAT EXAM: for EGD pending biopsy results.  eSigned:  Hart Carwinora M Brodie, MD 05/13/2014 3:54 PM    CC:  PATIENT NAME:  Beth Blake, Izzy N MR#: [id number]

## 2014-05-13 NOTE — Progress Notes (Signed)
Called to room to assist during endoscopic procedure.  Patient ID and intended procedure confirmed with present staff. Received instructions for my participation in the procedure from the performing physician.  

## 2014-05-13 NOTE — Progress Notes (Signed)
Patient states that she smokes marijuana "twice a day" to help her sleep.  States she has done that since she was 64 years old.    HIDDA scan scheduled for 05/29/14 at 7:15am [facility]. NPO 8 hours prior, and no stomach meds for 6 hours prior. Patient is aware of times and restrictions.

## 2014-05-13 NOTE — Telephone Encounter (Signed)
Per Dr. Juanda ChanceBrodie, patient needs HIDA with CCK. Scheduled at [facility] on 05/29/14 at 7:30 AM. NPO 8 hours prior and no stomach medications 6 hours prior. Notified Rosalita ChessmanSuzanne in [facility] recovery and gave her appointment and instructions to give patient.

## 2014-05-13 NOTE — Patient Instructions (Addendum)
YOU HAD AN ENDOSCOPIC PROCEDURE TODAY AT THE [facility]:   Refer to the procedure report that was given to you for any specific questions about what was found during the examination.  If the procedure report does not answer your questions, please call your gastroenterologist to clarify.  If you requested that your care partner not be given the details of your procedure findings, then the procedure report has been included in a sealed envelope for you to review at your convenience later.  YOU SHOULD EXPECT: Some feelings of bloating in the abdomen. Passage of more gas than usual.  Walking can help get rid of the air that was put into your GI tract during the procedure and reduce the bloating.   Please Note:  You might notice some irritation and congestion in your nose or some drainage.  This is from the oxygen used during your procedure.  There is no need for concern and it should clear up in a day or so.  SYMPTOMS TO REPORT IMMEDIATELY:    Following upper endoscopy (EGD)  Vomiting of blood or coffee ground material  New chest pain or pain under the shoulder blades  Painful or persistently difficult swallowing  New shortness of breath  Fever of 100F or higher  Black, tarry-looking stools  For urgent or emergent issues, a gastroenterologist can be reached at any hour by calling [phone].   DIET: Your first meal following the procedure should be a small meal and then it is ok to progress to your normal diet. Heavy or fried foods are harder to digest and may make you feel nauseous or bloated.  Likewise, meals heavy in dairy and vegetables can increase bloating.  Drink plenty of fluids but you should avoid alcoholic beverages for 24 hours.  ACTIVITY:  You should plan to take it easy for the rest of today and you should NOT DRIVE or use heavy machinery until tomorrow (because of the sedation medicines used during the test).    FOLLOW UP: Our staff will call the number listed  on your records the next business day following your procedure to check on you and address any questions or concerns that you may have regarding the information given to you following your procedure. If we do not reach you, we will leave a message.  However, if you are feeling well and you are not experiencing any problems, there is no need to return our call.  We will assume that you have returned to your regular daily activities without incident.  If any biopsies were taken you will be contacted by phone or by letter within the next 1-3 weeks.  Please call us at [phone] if you have not heard about the biopsies in 3 weeks.    SIGNATURES/CONFIDENTIALITY: You and/or your care partner have signed paperwork which will be entered into your electronic medical record.  These signatures attest to the fact that that the information above on your After Visit Summary has been reviewed and is understood.  Full responsibility of the confidentiality of this discharge information lies with you and/or your care-partner.  Staff from the 3rd floor will call you and schedule your Gallbladder scan and CCK.   You need to stop smoking. Read all handouts given to you by your recovery room nurse.

## 2014-05-13 NOTE — Progress Notes (Signed)
A/ox3 pleased with MAC, report to Suzanne RN 

## 2014-05-14 ENCOUNTER — Telehealth: Payer: Self-pay | Admitting: *Deleted

## 2014-05-14 NOTE — Telephone Encounter (Signed)
  Follow up Call-  Call back number 05/13/2014  Post procedure Call Back phone  # [phone]  Permission to leave phone message Yes     Patient questions:  Do you have a fever, pain , or abdominal swelling? No. Pain Score  0 *  Have you tolerated food without any problems? Yes.    Have you been able to return to your normal activities? Yes.    Do you have any questions about your discharge instructions: Diet   No. Medications  No. Follow up visit  No.  Do you have questions or concerns about your Care? No.  Actions: * If pain score is 4 or above: No action needed, pain <4.

## 2014-05-19 ENCOUNTER — Encounter: Payer: Self-pay | Admitting: Internal Medicine

## 2014-05-29 ENCOUNTER — Ambulatory Visit (HOSPITAL_COMMUNITY)
Admission: RE | Admit: 2014-05-29 | Discharge: 2014-05-29 | Disposition: A | Discharge status: Home / Self Care | Payer: Medicare HMO | Source: Ambulatory Visit | Attending: Internal Medicine | Admitting: Internal Medicine

## 2014-05-29 ENCOUNTER — Encounter (HOSPITAL_COMMUNITY): Payer: Self-pay

## 2014-05-29 DIAGNOSIS — K8020 Calculus of gallbladder without cholecystitis without obstruction: Secondary | ICD-10-CM

## 2014-05-29 DIAGNOSIS — R1011 Right upper quadrant pain: Secondary | ICD-10-CM | POA: Diagnosis present

## 2014-05-29 DIAGNOSIS — R634 Abnormal weight loss: Secondary | ICD-10-CM

## 2014-05-29 MED ORDER — TECHNETIUM TC 99M MEBROFENIN IV KIT
5.3000 | PACK | Freq: Once | INTRAVENOUS | Status: AC | PRN
  Administered 2014-05-29: 5.3 via INTRAVENOUS

## 2014-05-29 MED ORDER — SINCALIDE 5 MCG IJ SOLR
0.0200 ug/kg | Freq: Once | INTRAMUSCULAR | Status: AC
  Administered 2014-05-29: 1.1 ug via INTRAVENOUS

## 2014-06-01 ENCOUNTER — Other Ambulatory Visit: Payer: Self-pay | Admitting: *Deleted

## 2014-06-01 ENCOUNTER — Telehealth: Payer: Self-pay | Admitting: Internal Medicine

## 2014-06-01 MED ORDER — HYOSCYAMINE SULFATE 0.125 MG SL SUBL: SUBLINGUAL_TABLET | SUBLINGUAL | Status: AC

## 2014-06-01 NOTE — Telephone Encounter (Signed)
Results given. See results note 

## 2014-12-08 DIAGNOSIS — M8580 Other specified disorders of bone density and structure, unspecified site: Secondary | ICD-10-CM | POA: Insufficient documentation

## 2014-12-08 HISTORY — DX: Other specified disorders of bone density and structure, unspecified site: M85.80

## 2015-04-19 DIAGNOSIS — Z8719 Personal history of other diseases of the digestive system: Secondary | ICD-10-CM

## 2015-04-19 HISTORY — DX: Personal history of other diseases of the digestive system: Z87.19

## 2015-12-20 DIAGNOSIS — M25562 Pain in left knee: Secondary | ICD-10-CM

## 2015-12-20 HISTORY — DX: Pain in left knee: M25.562

## 2019-07-01 ENCOUNTER — Ambulatory Visit: Payer: Medicare HMO | Attending: Internal Medicine

## 2019-07-01 DIAGNOSIS — Z23 Encounter for immunization: Secondary | ICD-10-CM

## 2019-07-01 NOTE — Progress Notes (Signed)
   Covid-19 Vaccination Clinic  Name:  Beth Blake    MRN: [id number] DOB: [birth year]  07/01/2019  [name] was observed post Covid-19 immunization for 15 minutes without incident. She was provided with Vaccine Information Sheet and instruction to access the V-Safe system.   [name] was instructed to call 911 with any severe reactions post vaccine: Marland Kitchen Difficulty breathing  . Swelling of face and throat  . A fast heartbeat  . A bad rash all over body  . Dizziness and weakness   Immunizations Administered    Name Date Dose VIS Date Route   Pfizer COVID-19 Vaccine 07/01/2019  8:28 AM 0.3 mL 03/26/2018 Intramuscular   Manufacturer: [employer], [employer]   Lot: [id number]   NDC: [address]-4

## 2019-07-26 ENCOUNTER — Ambulatory Visit: Payer: Medicare HMO | Attending: Internal Medicine

## 2019-07-26 DIAGNOSIS — Z23 Encounter for immunization: Secondary | ICD-10-CM

## 2019-07-26 NOTE — Progress Notes (Signed)
   Covid-19 Vaccination Clinic  Name:  CORITA ALLINSON    MRN: [id number] DOB: 03/09/1950  07/26/2019  Ms. Fodge was observed post Covid-19 immunization for 15 minutes without incident. She was provided with Vaccine Information Sheet and instruction to access the V-Safe system.   Ms. Shellhammer was instructed to call 911 with any severe reactions post vaccine: Marland Kitchen Difficulty breathing  . Swelling of face and throat  . A fast heartbeat  . A bad rash all over body  . Dizziness and weakness   Immunizations Administered    Name Date Dose VIS Date Route   Pfizer COVID-19 Vaccine 07/26/2019 10:33 AM 0.3 mL 03/26/2018 Intramuscular   Manufacturer: [employer], [employer]   Lot: [id number]   NDC: [address]-7

## 2019-10-01 ENCOUNTER — Encounter: Payer: Self-pay | Admitting: Neurology

## 2020-01-21 ENCOUNTER — Other Ambulatory Visit: Payer: Self-pay

## 2020-01-21 ENCOUNTER — Ambulatory Visit: Payer: Medicare HMO | Admitting: Neurology

## 2020-01-21 ENCOUNTER — Encounter: Payer: Self-pay | Admitting: Neurology

## 2020-01-21 VITALS — BP 182/97 | HR 77 | Resp 18 | Ht 61.0 in | Wt 117.0 lb

## 2020-01-21 DIAGNOSIS — R413 Other amnesia: Secondary | ICD-10-CM

## 2020-01-21 MED ORDER — DONEPEZIL HCL 10 MG PO TABS
ORAL_TABLET | ORAL | 11 refills | Status: DC
Start: 2020-01-21 — End: 2020-09-29

## 2020-01-21 NOTE — Patient Instructions (Addendum)
1. Schedule MRI brain without contrast  2. Schedule Neurocognitive testing. Please bring your eyeglasses for the appointment  3. Start Donepezil (Aricept) 10mg : Take 1/2 tablet daily for 2 weeks, then increase to 1 tablet daily  4. Follow-up in 6-8 months, call for any changes  You have been referred for a neuropsychological evaluation (i.e., evaluation of memory and thinking abilities). Please bring someone with you to this appointment if possible, as it is helpful for the doctor to hear from both you and another adult who knows you well. Please bring eyeglasses and hearing aids if you wear them.    The evaluation will take approximately 3 hours and has two parts:    The first part is a clinical interview with the neuropsychologist (Dr. or Dr. Milbert Coulter). During the interview, the neuropsychologist will speak with you and the individual you brought to the appointment.     The second part of the evaluation is testing with the doctor's technician Roseanne Reno or Annabelle Harman). During the testing, the technician will ask you to remember different types of material, solve problems, and answer some questionnaires. Your family member will not be present for this portion of the evaluation.   Please note: We must reserve several hours of the neuropsychologist's time and the psychometrician's time for your evaluation appointment. As such, there is a No-Show fee of $100. If you are unable to attend any of your appointments, please contact our office as soon as possible to reschedule.    FALL PRECAUTIONS: Be cautious when walking. Scan the area for obstacles that may increase the risk of trips and falls. When getting up in the mornings, sit up at the edge of the bed for a few minutes before getting out of bed. Consider elevating the bed at the head end to avoid drop of blood pressure when getting up. Walk always in a well-lit room (use night lights in the walls). Avoid area rugs or power cords from appliances in the  middle of the walkways. Use a walker or a cane if necessary and consider physical therapy for balance exercise. Get your eyesight checked regularly.  FINANCIAL OVERSIGHT: Supervision, especially oversight when making financial decisions or transactions is also recommended.  HOME SAFETY: Consider the safety of the kitchen when operating appliances like stoves, microwave oven, and blender. Consider having supervision and share cooking responsibilities until no longer able to participate in those. Accidents with firearms and other hazards in the house should be identified and addressed as well.  DRIVING: Regarding driving, in patients with progressive memory problems, driving will be impaired. We advise to have someone else do the driving if trouble finding directions or if minor accidents are reported. Independent driving assessment is available to determine safety of driving.  ABILITY TO BE LEFT ALONE: If patient is unable to contact 911 operator, consider using LifeLine, or when the need is there, arrange for someone to stay with patients. Smoking is a fire hazard, consider supervision or cessation. Risk of wandering should be assessed by caregiver and if detected at any point, supervision and safe proof recommendations should be instituted.  MEDICATION SUPERVISION: Inability to self-administer medication needs to be constantly addressed. Implement a mechanism to ensure safe administration of the medications.  RECOMMENDATIONS FOR ALL PATIENTS WITH MEMORY PROBLEMS: 1. Continue to exercise (Recommend 30 minutes of walking everyday, or 3 hours every week) 2. Increase social interactions - continue going to [place] and enjoy social gatherings with friends and family 3. Eat healthy, avoid fried foods and  eat more fruits and vegetables 4. Maintain adequate blood pressure, blood sugar, and blood cholesterol level. Reducing the risk of stroke and cardiovascular disease also helps promoting better memory. 5.  Avoid stressful situations. Live a simple life and avoid aggravations. Organize your time and prepare for the next day in anticipation. 6. Sleep well, avoid any interruptions of sleep and avoid any distractions in the bedroom that may interfere with adequate sleep quality 7. Avoid sugar, avoid sweets as there is a strong link between excessive sugar intake, diabetes, and cognitive impairment We discussed the Mediterranean diet, which has been shown to help patients reduce the risk of progressive memory disorders and reduces cardiovascular risk. This includes eating fish, eat fruits and green leafy vegetables, nuts like almonds and hazelnuts, walnuts, and also use olive oil. Avoid fast foods and fried foods as much as possible. Avoid sweets and sugar as sugar use has been linked to worsening of memory function.  There is always a concern of gradual progression of memory problems. If this is the case, then we may need to adjust level of care according to patient needs. Support, both to the patient and caregiver, should then be put into place.   We have sent a referral to [facility] for your MRI and they will call you directly to schedule your appointment. They are located at [address] [facility]. If you need to contact them directly please call [phone].

## 2020-01-21 NOTE — Progress Notes (Signed)
NEUROLOGY CONSULTATION NOTE  Beth Blake MRN: [id number] DOB: 08/14/1950  Referring provider: Dr. Lupe Carney Primary care provider: Dr. Lupe Carney  Reason for consult:  Memory loss  Dear Dr Clovis Riley:  Thank you for your kind referral of Beth Blake for consultation of the above symptoms. Although her history is well known to you, please allow me to reiterate it for the purpose of our medical record. The patient was accompanied to the clinic by her husband who also provides collateral information. Records and images were personally reviewed where available.   HISTORY OF PRESENT ILLNESS: This is a 69 year old right-handed woman with a history of hypertension, hyperlipidemia, hypothyroidism presenting for evaluation of memory loss. Her husband is present to provide additional information. She feels her memory is "kinda half and half, sometimes I can't remember nothing." She started noticing changes a couple of months ago, her husband noticed changes 10-12 months ago. She would forget what she went to a room for. She repeats herself several times. She got lost driving one time to pick up her husband from surgery last March. Her husband feels she would not find her car in a big parking lot such as [employer], but does okay with smaller ones. She has left the burner on sometimes. She manages her own medications without issues, her husband agrees. He took over finances in April, she would get confused on what to do and where she is, she was not missing any payments. She has occasional word-finding difficulties. She saw her PCP in July 2021 with an MMSE of 20/30.  Her father had Alzheimer's disease. No history of significant head injuries or alcohol use.  She denies any headaches, dizziness, diplopia, dysarthria, dysphagia, back pain, focal numbness/tingling/weakness (she has occasional numbness in either hand or foot), bowel/bladder dysfunction. No anosmia, tremors, no falls. She has neck  pain. Sleep is good. Mood is good most of the time. Her husband denies any personality changes, no paranoia or hallucinations.    PAST MEDICAL HISTORY: Past Medical History:  Diagnosis Date  . Fibromyalgia   . Hyperlipemia   . Hypertension   . Hypothyroidism   . Mitral valve prolapse   . Pancreatitis   . Tobacco abuse   . Tobacco abuse counseling   . Vitamin D deficiency disease   . Weight loss     PAST SURGICAL HISTORY: Past Surgical History:  Procedure Laterality Date  . ABDOMINAL HYSTERECTOMY    . LUMBAR DISC SURGERY     x 5    MEDICATIONS: Current Outpatient Medications on File Prior to Visit  Medication Sig Dispense Refill  . amLODipine (NORVASC) 10 MG tablet Take 10 mg by mouth daily. Take 1 tab daily    . atenolol (TENORMIN) 25 MG tablet Take by mouth daily.    . hyoscyamine (LEVSIN/SL) 0.125 MG SL tablet Use one under tongue every 4-6 hours prn RUQ pain prn 30 tablet 0  . levothyroxine (SYNTHROID, LEVOTHROID) 125 MCG tablet Take 125 mcg by mouth daily before breakfast. Take 1 tab daily    . Multiple Vitamin (MULTIVITAMIN) tablet Take 1 tablet by mouth daily.    . simvastatin (ZOCOR) 20 MG tablet Take 20 mg by mouth daily.     No current facility-administered medications on file prior to visit.    ALLERGIES: Allergies  Allergen Reactions  . Meloxicam     Edema  . Sulfa Antibiotics     Swelling    FAMILY HISTORY: Family History  Problem Relation  Age of Onset  . Heart disease Father   . Asthma Sister   . Breast cancer Mother   . Breast cancer Sister        x 2  . Emphysema Paternal Grandfather   . Colon cancer Neg Hx     SOCIAL HISTORY: Social History   Socioeconomic History  . Marital status: Legally Separated    Spouse name: Not on file  . Number of children: 3  . Years of education: Not on file  . Highest education level: Not on file  Occupational History  . Occupation: disabled  Tobacco Use  . Smoking status: Current Every Day Smoker     Packs/day: 1.00    Years: 47.00    Pack years: 47.00    Types: Cigarettes  . Smokeless tobacco: Never Used  Substance and Sexual Activity  . Alcohol use: No    Alcohol/week: 0.0 standard drinks  . Drug use: Yes    Comment: Smokes Marijuana twice daily  . Sexual activity: Not on file  Other Topics Concern  . Not on file  Social History Narrative   Right handed   Drinks caffeine   One story home   Social Determinants of Health   Financial Resource Strain: Not on file  Food Insecurity: Not on file  Transportation Needs: Not on file  Physical Activity: Not on file  Stress: Not on file  Social Connections: Not on file  Intimate Partner Violence: Not on file     PHYSICAL EXAM: Vitals:   01/21/20 1014  BP: (!) 182/97  Pulse: 77  Resp: 18  SpO2: 99%   General: No acute distress Head:  Normocephalic/atraumatic Skin/Extremities: No rash, no edema Neurological Exam: Mental status: alert and oriented to person, place, and time, no dysarthria or aphasia, Fund of knowledge is appropriate.  Recent and remote memory are impaired. Attention and concentration are reduced. She did not bring her reading glasses and MOCA blind was done in the office, 9/22.  Montreal Cognitive Assessment Blind 01/21/2020  Attention: Read list of digits (0/2) 1  Attention: Read list of letters (0/1) 1  Attention: Serial 7 subtraction starting at 100 (0/3) 0  Language: Repeat phrase (0/2) 0  Language : Fluency (0/1) 0  Abstraction (0/2) 0  Delayed Recall (0/5) 1  Orientation (0/6) 5  Total 8  Adjusted Score (based on education) 9    Cranial nerves: CN I: not tested CN II: pupils equal, round and reactive to light, visual fields intact CN III, IV, VI:  full range of motion, no nystagmus, no ptosis CN V: facial sensation intact CN VII: upper and lower face symmetric CN VIII: hearing intact to conversation CN IX, X: gag intact, uvula midline CN XI: sternocleidomastoid and trapezius muscles  intact CN XII: tongue midline Bulk & Tone: normal, no fasciculations. Motor: 5/5 throughout with no pronator drift. Sensation: intact to light touch, cold, pin, vibration sense.  No extinction to double simultaneous stimulation.  Romberg test negative Deep Tendon Reflexes: +2 throughout Cerebellar: no incoordination on finger to nose testing Gait: narrow-based and steady, able to tandem walk adequately. Tremor: none   IMPRESSION: This is a 69 year old right-handed woman with a history of hypertension, hyperlipidemia, hypothyroidism presenting for evaluation of memory loss. Her neurological exam is non-focal, she did not bring her reading glasses today, MOCA blind done was 9/22. MMSE at PCP office in July 2021 was 20/30. 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 MRI brain without contrast and Neuropsychological testing will be  ordered to further evaluate cognitive concerns. She was advised to bring her readers for the memory testing. We discussed starting Donepezil, including side effects and expectations from medication. Start Donepezil 10mg  1/2 tablet daily for 2 weeks, then increase to 1 tablet daily. We discussed the importance of control of vascular risk factors, physical exercise, and brain stimulation exercise for brain health. Follow-up in 6-8 months, they know to call for any changes.   Thank you for allowing me to participate in the care of this patient. Please do not hesitate to call for any questions or concerns.   , M.D.  CC: Dr. Patrcia Dolly

## 2020-02-18 DIAGNOSIS — K219 Gastro-esophageal reflux disease without esophagitis: Secondary | ICD-10-CM | POA: Diagnosis not present

## 2020-02-18 DIAGNOSIS — Z008 Encounter for other general examination: Secondary | ICD-10-CM | POA: Diagnosis not present

## 2020-02-18 DIAGNOSIS — Z720 Tobacco use: Secondary | ICD-10-CM | POA: Diagnosis not present

## 2020-02-18 DIAGNOSIS — E039 Hypothyroidism, unspecified: Secondary | ICD-10-CM | POA: Diagnosis not present

## 2020-02-18 DIAGNOSIS — Z833 Family history of diabetes mellitus: Secondary | ICD-10-CM | POA: Diagnosis not present

## 2020-02-18 DIAGNOSIS — Z818 Family history of other mental and behavioral disorders: Secondary | ICD-10-CM | POA: Diagnosis not present

## 2020-02-18 DIAGNOSIS — I10 Essential (primary) hypertension: Secondary | ICD-10-CM | POA: Diagnosis not present

## 2020-02-20 ENCOUNTER — Ambulatory Visit
Admission: RE | Admit: 2020-02-20 | Discharge: 2020-02-20 | Disposition: A | Discharge status: Home / Self Care | Payer: Medicare HMO | Source: Ambulatory Visit | Attending: Neurology | Admitting: Neurology

## 2020-02-20 ENCOUNTER — Other Ambulatory Visit: Payer: Self-pay

## 2020-02-27 ENCOUNTER — Telehealth: Payer: Self-pay

## 2020-02-27 NOTE — Telephone Encounter (Signed)
-----   Message from Karen M Aquino, MD sent at 02/27/2020  3:11 PM EST ----- Pls let patient/husband know the MRI brain looked fine, no tumor, stroke, or bleed, it showed age related changes. Proceed with memory testing as scheduled. Thanks 

## 2020-02-27 NOTE — Telephone Encounter (Signed)
Pt called no answer no voice mail picked up will try to call again later

## 2020-03-01 ENCOUNTER — Encounter: Payer: Medicare HMO | Admitting: Psychology

## 2020-03-01 ENCOUNTER — Telehealth: Payer: Self-pay

## 2020-03-01 NOTE — Telephone Encounter (Signed)
-----   Message from Van Clines, MD sent at 02/27/2020  3:11 PM EST ----- Pls let patient/husband know the MRI brain looked fine, no tumor, stroke, or bleed, it showed age related changes. Proceed with memory testing as scheduled. Thanks

## 2020-03-01 NOTE — Telephone Encounter (Signed)
Spoke with MR Beth Blake informed him that her MRI brain looked fine, no tumor, stroke, or bleed, it showed age related changes. Proceed with memory testing as scheduled

## 2020-03-08 ENCOUNTER — Encounter: Payer: Medicare HMO | Admitting: Psychology

## 2020-03-29 DIAGNOSIS — I10 Essential (primary) hypertension: Secondary | ICD-10-CM | POA: Diagnosis not present

## 2020-03-29 DIAGNOSIS — E46 Unspecified protein-calorie malnutrition: Secondary | ICD-10-CM | POA: Diagnosis not present

## 2020-09-29 ENCOUNTER — Ambulatory Visit: Payer: Medicare HMO | Admitting: Neurology

## 2020-09-29 ENCOUNTER — Other Ambulatory Visit: Payer: Self-pay

## 2020-09-29 ENCOUNTER — Encounter: Payer: Self-pay | Admitting: Neurology

## 2020-09-29 VITALS — BP 147/76 | HR 72 | Ht 65.0 in | Wt 125.0 lb

## 2020-09-29 DIAGNOSIS — R413 Other amnesia: Secondary | ICD-10-CM

## 2020-09-29 DIAGNOSIS — G3184 Mild cognitive impairment, so stated: Secondary | ICD-10-CM

## 2020-09-29 MED ORDER — DONEPEZIL HCL 10 MG PO TABS: ORAL_TABLET | ORAL | 11 refills | Status: DC

## 2020-09-29 NOTE — Progress Notes (Signed)
NEUROLOGY FOLLOW UP OFFICE NOTE  Beth Blake [id number] 18-Mar-1950  HISTORY OF PRESENT ILLNESS: I had the pleasure of seeing Beth Blake in follow-up in the neurology clinic on 09/29/2020.  The patient was last seen 8 months ago for memory loss. She is again accompanied by her husband who helps supplement the history today.  Records and images were personally reviewed where available.  She did not bring her glasses on last visit, MOCA blind was 9/22. I personally reviewed MRI brain without contrast done 01/2020 which did not show any acute changes, minimal chronic microvascular disease. She was started on Donepezil 10mg  daily on  last visit, no side effects.  She states her memory is getting better. Her husband reports memory is about the same, "might be a little better." She hardly drives, no issues driving 2-3 blocks away. She manages her own medications and may take them later in the day but remembers to take them daily. Her husband manages finances. He does most of the cooking, she has not left the stove on. She repeats 3 times during the visit that she would go to get something and forget what she went for, then remembers it soon after. Her husband denies any personality changes, paranoia or hallucinations. She denies any headaches, dizziness, focal numbness/tingling/weakness, no falls. Sleep is sometimes okay, depending on her dog, she feels rested on awakening. Mood is pretty good.     History on Initial Assessment 01/21/2020: This is a 70 year old right-handed woman with a history of hypertension, hyperlipidemia, hypothyroidism presenting for evaluation of memory loss. Her husband is present to provide additional information. She feels her memory is "kinda half and half, sometimes I can't remember nothing." She started noticing changes a couple of months ago, her husband noticed changes 10-12 months ago. She would forget what she went to a room for. She repeats herself several times. She got  lost driving one time to pick up her husband from surgery last March. Her husband feels she would not find her car in a big parking lot such as [employer], but does okay with smaller ones. She has left the burner on sometimes. She manages her own medications without issues, her husband agrees. He took over finances in April, she would get confused on what to do and where she is, she was not missing any payments. She has occasional word-finding difficulties. She saw her PCP in July 2021 with an MMSE of 20/30.  Her father had Alzheimer's disease. No history of significant head injuries or alcohol use.  She denies any headaches, dizziness, diplopia, dysarthria, dysphagia, back pain, focal numbness/tingling/weakness (she has occasional numbness in either hand or foot), bowel/bladder dysfunction. No anosmia, tremors, no falls. She has neck pain. Sleep is good. Mood is good most of the time. Her husband denies any personality changes, no paranoia or hallucinations.    PAST MEDICAL HISTORY: Past Medical History:  Diagnosis Date   Fibromyalgia    Hyperlipemia    Hypertension    Hypothyroidism    Mitral valve prolapse    Pancreatitis    Tobacco abuse    Tobacco abuse counseling    Vitamin D deficiency disease    Weight loss     MEDICATIONS: Current Outpatient Medications on File Prior to Visit  Medication Sig Dispense Refill   amLODipine (NORVASC) 10 MG tablet Take 10 mg by mouth daily. Take 1 tab daily     atenolol (TENORMIN) 25 MG tablet Take by mouth daily.  donepezil (ARICEPT) 10 MG tablet Take 1/2 tablet daily for 2 weeks, then increase to 1 tablet daily 30 tablet 11   hyoscyamine (LEVSIN/SL) 0.125 MG SL tablet Use one under tongue every 4-6 hours prn RUQ pain prn 30 tablet 0   levothyroxine (SYNTHROID, LEVOTHROID) 125 MCG tablet Take 125 mcg by mouth daily before breakfast. Take 1 tab daily     Multiple Vitamin (MULTIVITAMIN) tablet Take 1 tablet by mouth daily.     simvastatin (ZOCOR) 20  MG tablet Take 20 mg by mouth daily.     No current facility-administered medications on file prior to visit.    ALLERGIES: Allergies  Allergen Reactions   Meloxicam     Edema   Sulfa Antibiotics     Swelling    FAMILY HISTORY: Family History  Problem Relation Age of Onset   Heart disease Father    Asthma Sister    Breast cancer Mother    Breast cancer Sister        x 2   Emphysema Paternal Grandfather    Colon cancer Neg Hx     SOCIAL HISTORY: Social History   Socioeconomic History   Marital status: Legally Separated    Spouse name: Not on file   Number of children: 3   Years of education: Not on file   Highest education level: Not on file  Occupational History   Occupation: disabled  Tobacco Use   Smoking status: Every Day    Packs/day: 1.00    Years: 47.00    Pack years: 47.00    Types: Cigarettes   Smokeless tobacco: Never  Vaping Use   Vaping Use: Never used  Substance and Sexual Activity   Alcohol use: No    Alcohol/week: 0.0 standard drinks   Drug use: Yes    Types: Marijuana    Comment: Smokes Marijuana twice daily   Sexual activity: Not on file  Other Topics Concern   Not on file  Social History Narrative   Right handed   Drinks caffeine   One story home   Social Determinants of Health   Financial Resource Strain: Not on file  Food Insecurity: Not on file  Transportation Needs: Not on file  Physical Activity: Not on file  Stress: Not on file  Social Connections: Not on file  Intimate Partner Violence: Not on file     PHYSICAL EXAM: Vitals:   09/29/20 0841  BP: (!) 147/76  Pulse: 72  SpO2: 99%   General: No acute distress Head:  Normocephalic/atraumatic Skin/Extremities: No rash, no edema Neurological Exam: alert and oriented to person, place, year. Did not know month, season is Spring. No aphasia or dysarthria. Fund of knowledge is appropriate.  Recent and remote memory are impaired, repeats herself several times during the  visit, 0/3 delayed recall. Attention and concentration are reduced, 2/5 WORLD backwards ("DLOLD"). Able to name and repeat. Cranial nerves: Pupils equal, round. Extraocular movements intact with no nystagmus. Visual fields full.  No facial asymmetry.  Motor: Bulk and tone normal, muscle strength 5/5 throughout with no pronator drift.   Finger to nose testing intact.  Gait narrow-based and steady, no ataxia.   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 She did not bring her readers again today, she repeats herself several times during the visit. MOCA blind 9/22 in 12/2019, (MMSE 20/30 in 07/2019). They both state they were unaware of her  prior scheduled Neuropsychological evaluation that she did not attend, this will be rescheduled for her, she was instructed to bring her readers for the testing. MRI brain no acute changes. Continue Donepezil 10mg  daily. We again discussed the importance of control of vascular risk factors, physical exercises, brain stimulation exercises, and MIND diet for overall brain health. Follow-up in 6 months, they know to call for any changes.   Thank you for allowing me to participate in her care.  Please do not hesitate to call for any questions or concerns.    , M.D.   CC: Dr. Patrcia Dolly

## 2020-09-29 NOTE — Patient Instructions (Addendum)
Continue Donepezil 10mg  daily  2. Proceed with Neurocognitive testing, please bring your readers  3. Follow-up in 6 months, call for any changes  You have been referred for a neuropsychological evaluation (i.e., evaluation of memory and thinking abilities). Please bring someone with you to this appointment if possible, as it is helpful for the doctor to hear from both you and another adult who knows you well. Please bring eyeglasses and hearing aids if you wear them.    The evaluation will take approximately 3 hours and has two parts:   The first part is a clinical interview with the neuropsychologist (Dr. or Dr. Milbert Coulter). During the interview, the neuropsychologist will speak with you and the individual you brought to the appointment.    The second part of the evaluation is testing with the doctor's technician Roseanne Reno or Annabelle Harman). During the testing, the technician will ask you to remember different types of material, solve problems, and answer some questionnaires. Your family member will not be present for this portion of the evaluation.   Please note: We must reserve several hours of the neuropsychologist's time and the psychometrician's time for your evaluation appointment. As such, there is a No-Show fee of $100. If you are unable to attend any of your appointments, please contact our office as soon as possible to reschedule.   FALL PRECAUTIONS: Be cautious when walking. Scan the area for obstacles that may increase the risk of trips and falls. When getting up in the mornings, sit up at the edge of the bed for a few minutes before getting out of bed. Consider elevating the bed at the head end to avoid drop of blood pressure when getting up. Walk always in a well-lit room (use night lights in the walls). Avoid area rugs or power cords from appliances in the middle of the walkways. Use a walker or a cane if necessary and consider physical therapy for balance exercise. Get your eyesight checked  regularly.  HOME SAFETY: Consider the safety of the kitchen when operating appliances like stoves, microwave oven, and blender. Consider having supervision and share cooking responsibilities until no longer able to participate in those. Accidents with firearms and other hazards in the house should be identified and addressed as well.  DRIVING: Regarding driving, in patients with progressive memory problems, driving will be impaired. We advise to have someone else do the driving if trouble finding directions or if minor accidents are reported. Independent driving assessment is available to determine safety of driving.  ABILITY TO BE LEFT ALONE: If patient is unable to contact 911 operator, consider using Life[employer], or when the need is there, arrange for someone to stay with patients. Smoking is a fire hazard, consider supervision or cessation. Risk of wandering should be assessed by caregiver and if detected at any point, supervision and safe proof recommendations should be instituted.  MEDICATION SUPERVISION: Inability to self-administer medication needs to be constantly addressed. Implement a mechanism to ensure safe administration of the medications.  RECOMMENDATIONS FOR ALL PATIENTS WITH MEMORY PROBLEMS: 1. Continue to exercise (Recommend 30 minutes of walking everyday, or 3 hours every week) 2. Increase social interactions - continue going to [place] and enjoy social gatherings with friends and family 3. Eat healthy, avoid fried foods and eat more fruits and vegetables 4. Maintain adequate blood pressure, blood sugar, and blood cholesterol level. Reducing the risk of stroke and cardiovascular disease also helps promoting better memory. 5. Avoid stressful situations. Live a simple life and avoid aggravations. Organize  your time and prepare for the next day in anticipation. 6. Sleep well, avoid any interruptions of sleep and avoid any distractions in the bedroom that may interfere with adequate sleep  quality 7. Avoid sugar, avoid sweets as there is a strong link between excessive sugar intake, diabetes, and cognitive impairment We discussed the Mediterranean diet, which has been shown to help patients reduce the risk of progressive memory disorders and reduces cardiovascular risk. This includes eating fish, eat fruits and green leafy vegetables, nuts like almonds and hazelnuts, walnuts, and also use olive oil. Avoid fast foods and fried foods as much as possible. Avoid sweets and sugar as sugar use has been linked to worsening of memory function.  There is always a concern of gradual progression of memory problems. If this is the case, then we may need to adjust level of care according to patient needs. Support, both to the patient and caregiver, should then be put into place.       Mediterranean Diet  Why follow it? Research shows. Those who follow the Mediterranean diet have a reduced risk of heart disease  The diet is associated with a reduced incidence of Parkinson's and Alzheimer's diseases People following the diet may have longer life expectancies and lower rates of chronic diseases  The Dietary Guidelines for Americans recommends the Mediterranean diet as an eating plan to promote health and prevent disease  What Is the Mediterranean Diet?  Healthy eating plan based on typical foods and recipes of Mediterranean-style cooking The diet is primarily a plant based diet; these foods should make up a majority of meals   Starches - Plant based foods should make up a majority of meals - They are an important sources of vitamins, minerals, energy, antioxidants, and fiber - Choose whole grains, foods high in fiber and minimally processed items  - Typical grain sources include wheat, oats, barley, corn, brown rice, bulgar, farro, millet, polenta, couscous  - Various types of beans include chickpeas, lentils, fava beans, black beans, white beans   Fruits  Veggies - Large quantities of  antioxidant rich fruits & veggies; 6 or more servings  - Vegetables can be eaten raw or lightly drizzled with oil and cooked  - Vegetables common to the traditional Mediterranean Diet include: artichokes, arugula, beets, broccoli, brussel sprouts, cabbage, carrots, celery, collard greens, cucumbers, eggplant, kale, leeks, lemons, lettuce, mushrooms, okra, onions, peas, peppers, potatoes, pumpkin, radishes, rutabaga, shallots, spinach, sweet potatoes, turnips, zucchini - Fruits common to the Mediterranean Diet include: apples, apricots, avocados, cherries, clementines, dates, figs, grapefruits, grapes, melons, nectarines, oranges, peaches, pears, pomegranates, strawberries, tangerines  Fats - Replace butter and margarine with healthy oils, such as olive oil, canola oil, and tahini  - Limit nuts to no more than a handful a day  - Nuts include walnuts, almonds, pecans, pistachios, pine nuts  - Limit or avoid candied, honey roasted or heavily salted nuts - Olives are central to the [employer] - can be eaten whole or used in a variety of dishes   Meats Protein - Limiting red meat: no more than a few times a month - When eating red meat: choose lean cuts and keep the portion to the size of deck of cards - Eggs: approx. 0 to 4 times a week  - Fish and lean poultry: at least 2 a week  - Healthy protein sources include, chicken, [place], lean beef, lamb - Increase intake of seafood such as tuna, salmon, trout, mackerel, shrimp, scallops - Avoid  or limit high fat processed meats such as sausage and bacon  Dairy - Include moderate amounts of low fat dairy products  - Focus on healthy dairy such as fat free yogurt, skim milk, low or reduced fat cheese - Limit dairy products higher in fat such as whole or 2% milk, cheese, ice cream  Alcohol - Moderate amounts of red wine is ok  - No more than 5 oz daily for women (all ages) and men older than age 69  - No more than 10 oz of wine daily for men younger  than 40  Other - Limit sweets and other desserts  - Use herbs and spices instead of salt to flavor foods  - Herbs and spices common to the traditional Mediterranean Diet include: basil, bay leaves, chives, cloves, cumin, fennel, garlic, lavender, marjoram, mint, oregano, parsley, pepper, rosemary, sage, savory, sumac, tarragon, thyme   It's not just a diet, it's a lifestyle:  The Mediterranean diet includes lifestyle factors typical of those in the region  Foods, drinks and meals are best eaten with others and savored Daily physical activity is important for overall good health This could be strenuous exercise like running and aerobics This could also be more leisurely activities such as walking, housework, yard-work, or taking the stairs Moderation is the key; a balanced and healthy diet accommodates most foods and drinks Consider portion sizes and frequency of consumption of certain foods   Meal Ideas & Options:  Breakfast:  Whole wheat toast or whole wheat English muffins with peanut butter & hard boiled egg Steel cut oats topped with apples & cinnamon and skim milk  Fresh fruit: banana, strawberries, melon, berries, peaches  Smoothies: strawberries, bananas, greek yogurt, peanut butter Low fat greek yogurt with blueberries and granola  Egg white omelet with spinach and mushrooms Breakfast couscous: whole wheat couscous, apricots, skim milk, cranberries  Sandwiches:  Hummus and grilled vegetables (peppers, zucchini, squash) on whole wheat bread   Grilled chicken on whole wheat pita with lettuce, tomatoes, cucumbers or tzatziki  [place] salad on whole wheat bread: tuna salad made with greek yogurt, olives, red peppers, capers, green onions Garlic rosemary lamb pita: lamb sauted with garlic, rosemary, salt & pepper; add lettuce, cucumber, greek yogurt to pita - flavor with lemon juice and black pepper  Seafood:  Mediterranean grilled salmon, seasoned with garlic, basil, parsley, lemon  juice and black pepper Shrimp, lemon, and spinach whole-grain pasta salad made with low fat greek yogurt  Seared scallops with lemon orzo  Seared tuna steaks seasoned salt, pepper, coriander topped with tomato mixture of olives, tomatoes, olive oil, minced garlic, parsley, green onions and cappers  Meats:  Herbed greek chicken salad with kalamata olives, cucumber, feta  Red bell peppers stuffed with spinach, bulgur, lean ground beef (or lentils) & topped with feta   Kebabs: skewers of chicken, tomatoes, onions, zucchini, squash  [place] burgers: made with red onions, mint, dill, lemon juice, feta cheese topped with roasted red peppers Vegetarian Cucumber salad: cucumbers, artichoke hearts, celery, red onion, feta cheese, tossed in olive oil & lemon juice  Hummus and whole grain pita points with a greek salad (lettuce, tomato, feta, olives, cucumbers, red onion) Lentil soup with celery, carrots made with vegetable broth, garlic, salt and pepper  Tabouli salad: parsley, bulgur, mint, scallions, cucumbers, tomato, radishes, lemon juice, olive oil, salt and pepper.

## 2020-10-28 ENCOUNTER — Other Ambulatory Visit: Payer: Self-pay | Admitting: Family Medicine

## 2020-10-28 DIAGNOSIS — Z1159 Encounter for screening for other viral diseases: Secondary | ICD-10-CM | POA: Diagnosis not present

## 2020-10-28 DIAGNOSIS — E2839 Other primary ovarian failure: Secondary | ICD-10-CM

## 2020-10-28 DIAGNOSIS — E7800 Pure hypercholesterolemia, unspecified: Secondary | ICD-10-CM | POA: Diagnosis not present

## 2020-10-28 DIAGNOSIS — Z1231 Encounter for screening mammogram for malignant neoplasm of breast: Secondary | ICD-10-CM

## 2020-11-04 ENCOUNTER — Other Ambulatory Visit: Payer: Medicare HMO

## 2020-11-04 ENCOUNTER — Ambulatory Visit: Payer: Medicare HMO

## 2020-11-18 ENCOUNTER — Ambulatory Visit: Payer: Medicare HMO

## 2020-11-18 ENCOUNTER — Other Ambulatory Visit: Payer: Self-pay

## 2020-11-18 ENCOUNTER — Ambulatory Visit
Admission: RE | Admit: 2020-11-18 | Discharge: 2020-11-18 | Disposition: A | Discharge status: Home / Self Care | Payer: Medicare HMO | Source: Ambulatory Visit | Attending: Family Medicine | Admitting: Family Medicine

## 2020-11-18 ENCOUNTER — Other Ambulatory Visit: Payer: Medicare HMO

## 2020-11-18 DIAGNOSIS — E2839 Other primary ovarian failure: Secondary | ICD-10-CM

## 2020-12-30 DIAGNOSIS — L03115 Cellulitis of right lower limb: Secondary | ICD-10-CM | POA: Diagnosis not present

## 2020-12-30 DIAGNOSIS — M79604 Pain in right leg: Secondary | ICD-10-CM | POA: Diagnosis not present

## 2020-12-30 DIAGNOSIS — M79601 Pain in right arm: Secondary | ICD-10-CM | POA: Diagnosis not present

## 2020-12-30 DIAGNOSIS — M542 Cervicalgia: Secondary | ICD-10-CM | POA: Diagnosis not present

## 2021-03-09 ENCOUNTER — Other Ambulatory Visit: Payer: Self-pay

## 2021-03-09 ENCOUNTER — Ambulatory Visit (INDEPENDENT_AMBULATORY_CARE_PROVIDER_SITE_OTHER): Payer: Medicare HMO | Admitting: Psychology

## 2021-03-09 ENCOUNTER — Ambulatory Visit: Payer: Medicare HMO

## 2021-03-09 ENCOUNTER — Encounter: Payer: Self-pay | Admitting: Psychology

## 2021-03-09 DIAGNOSIS — F0280 Dementia in other diseases classified elsewhere without behavioral disturbance: Secondary | ICD-10-CM

## 2021-03-09 DIAGNOSIS — R4189 Other symptoms and signs involving cognitive functions and awareness: Secondary | ICD-10-CM

## 2021-03-09 DIAGNOSIS — G309 Alzheimer's disease, unspecified: Secondary | ICD-10-CM | POA: Diagnosis not present

## 2021-03-09 DIAGNOSIS — F0670 Mild neurocognitive disorder due to known physiological condition without behavioral disturbance: Secondary | ICD-10-CM | POA: Insufficient documentation

## 2021-03-09 DIAGNOSIS — I341 Nonrheumatic mitral (valve) prolapse: Secondary | ICD-10-CM | POA: Insufficient documentation

## 2021-03-09 HISTORY — DX: Dementia in other diseases classified elsewhere, unspecified severity, without behavioral disturbance, psychotic disturbance, mood disturbance, and anxiety: F02.80

## 2021-03-09 NOTE — Progress Notes (Signed)
° °  Psychometrician Note   Cognitive testing was administered to Marc Morgans by Cruzita Lederer, B.S. (psychometrist) under the supervision of Dr. Christia Reading, Ph.D., licensed psychologist on 03/09/2021. Ms. Kerper did not appear overtly distressed by the testing session per behavioral observation or responses across self-report questionnaires. Rest breaks were offered.    The battery of tests administered was selected by Dr. Christia Reading, Ph.D. with consideration to Ms. Klinke's current level of functioning, the nature of her symptoms, emotional and behavioral responses during interview, level of literacy, observed level of motivation/effort, and the nature of the referral question. This battery was communicated to the psychometrist. Communication between Dr. Christia Reading, Ph.D. and the psychometrist was ongoing throughout the evaluation and Dr. Christia Reading, Ph.D. was immediately accessible at all times. Dr. Christia Reading, Ph.D. provided supervision to the psychometrist on the date of this service to the extent necessary to assure the quality of all services provided.    Marc Morgans will return within approximately 1-2 weeks for an interactive feedback session with Dr. Melvyn Novas at which time her test performances, clinical impressions, and treatment recommendations will be reviewed in detail. Ms. Minnis understands she can contact our office should she require our assistance before this time.  A total of 110 minutes of billable time were spent face-to-face with Ms. Bacote by the psychometrist. This includes both test administration and scoring time. Billing for these services is reflected in the clinical report generated by Dr. Christia Reading, Ph.D.  This note reflects time spent with the psychometrician and does not include test scores or any clinical interpretations made by Dr. Melvyn Novas. The full report will follow in a separate note.

## 2021-03-09 NOTE — Progress Notes (Signed)
NEUROPSYCHOLOGICAL EVALUATION [facility] [place] Department of Neurology  Date of Evaluation: March 09, 2021  Reason for Referral:   KELLYN MCCARY is a 71 y.o. right-handed Caucasian female referred by Patrcia Dolly, M.D., to characterize her current cognitive functioning and assist with diagnostic clarity and treatment planning in the context of subjective cognitive decline and a family history of Alzheimer's disease.   Assessment and Plan:   Clinical Impression(s): Ms. Abt pattern of performance is suggestive of severe impairment surrounding all aspects of learning and memory. Additional impairment was exhibited across processing speed, attention/concentration, executive functioning, and receptive language. Performance variability was noted across semantic fluency and visuospatial abilities. Performance was largely age-appropriate across safety/judgment, phonemic fluency, and confrontation naming. Regarding ADLs, Ms. Bhullar husband has fully taken over medication management, financial management, and bill paying responsibilities. She has also significantly limited her driving due to cognitive concerns. Given the combination of cognitive and functional impairment, Ms. Herold Harms meets diagnostic criteria for a Major Neurocognitive Disorder ("dementia") at the present time.  Regarding etiology, I have primary concerns surrounding Alzheimer's disease as the cause for ongoing impairment. Across memory tasks, Ms. Champa was fully amnestic (i.e., 0% retention) across all three memory tasks. She also generally performed poorly across yes/no recognition trials, suggesting the presence of rapid forgetting and an evolving and already quite severe memory storage deficit. Both of these memory findings are hallmark characteristics of Alzheimer's disease. Despite intact confrontation naming, impairments in executive functioning and variability in semantic fluency and visuospatial abilities  would represent otherwise fairly typical disease progression. Admittedly, impairment in processing speed and attention/concentration is somewhat more advanced than what is typically seen in early to middle stages of Alzheimer's disease. However, this disease process remains the most likely cause at the present time.   Past neuroimaging did not reveal notable cerebrovascular ischemic changes nor a prior stroke, making a significant vascular contribution quite unlikely at the present time. She does not demonstrate common behavioral characteristics of Lewy body dementia (e.g., parkinsonian features, REM sleep behaviors, fully-formed visual hallucinations). Memory performances are also more consistent with Alzheimer's disease, making a Lewy body presentation less likely. I do not see compelling evidence for frontotemporal dementia or a more rare parkinsonian presentation at the present time. Continued medical monitoring will be important moving forward.   Recommendations: Ms. Kapusta has already been prescribed a medication aimed to address memory loss and concerns surrounding Alzheimer's disease (i.e., donepezil/Aricept). She is encouraged to continue taking this medication as prescribed. It is important to highlight that this medication has been shown to slow functional decline in some individuals. There is no current treatment which can stop or reverse cognitive decline when caused by a neurodegenerative illness.   She will likely benefit from the establishment and maintenance of a routine in order to maximize functional abilities over time.  It will be important for Ms. Isley to have another person with her when in situations where she may need to process information, weigh the pros and cons of different options, and make decisions, in order to ensure that she fully understands and recalls all information to be considered.  If not already done, Ms. Partridge and her family may want to discuss her wishes regarding  durable power of attorney and medical decision making, so that she can have input into these choices.  Performance across neurocognitive testing is not a strong predictor of an individual's safety operating a motor vehicle. Should her family wish to pursue a formalized driving  evaluation, they could reach out to the following agencies: [employer] in [place]: [phone] [facility]: [phone] [facility]: [phone] [facility]: [phone] or [phone]  Ms. Raso is encouraged to attend to lifestyle factors for brain health (e.g., regular physical exercise, good nutrition habits, regular participation in cognitively-stimulating activities, and general stress management techniques), which are likely to have benefits for both emotional adjustment and cognition. Optimal control of vascular risk factors (including safe cardiovascular exercise and adherence to dietary recommendations) is encouraged. Continued participation in activities which provide mental stimulation and social interaction is also recommended.   Important information should be provided to Ms. Malek in written format in all instances. This information should be placed in a highly frequented and easily visible location within her home to promote recall. External strategies such as written notes in a consistently used memory journal, visual and nonverbal auditory cues such as a calendar on the refrigerator or appointments with alarm, such as on a cell phone, can also help maximize recall.  To address problems with processing speed, she may wish to consider:   -Ensuring that she is alerted when essential material or instructions are being presented   -Adjusting the speed at which new information is presented   -Allowing for more time in comprehending, processing, and responding in conversation  To address problems with fluctuating attention, she may wish to consider:   -Avoiding  external distractions when needing to concentrate   -Limiting exposure to fast paced environments with multiple sensory demands   -Writing down complicated information and using checklists   -Attempting and completing one task at a time (i.e., no multi-tasking)   -Verbalizing aloud each step of a task to maintain focus   -Reducing the amount of information considered at one time  Review of Records:   Ms. Greenley was seen by [facility] Marland KitchenPatrcia Dolly, M.D.) on 01/21/2020 for an evaluation of memory loss. At that time, she described her memory as "kinda half and half, sometimes I can't remember nothing." Changes were first noticed a couple of months prior; her husband noted changes occurring roughly a year earlier. Examples included her repeating herself often and entering rooms and forgetting her original intention. They denied headaches, dizziness, diplopia, dysarthria, dysphagia, back pain, focal numbness/tingling/weakness (she has occasional numbness in either hand or foot), bowel/bladder dysfunction, anosmia, tremors, falls, sleep dysfunction, personality changes, paranoia, or hallucinations. She has neck pain. No mood-related concerns were noted. Regarding ADLs, she reported managing her medications at that time. Her husband took over Landscape architect and bill paying around April 2021. There was also report of Ms. Menge getting lost while driving to pick her husband up from a surgical appointment in March 2021. Performance on a brief cognitive screening instrument (MMSE) administered by her PCP in July 2021 was said to be 20/30.   She was scheduled for a neuropsychological evaluation with myself on 03/01/2020 but was unable to attend.  She was most recently seen by Dr. Karel Jarvis for follow-up on 09/29/2020. At that time, she reported her belief that her memory had improved. Her husband stated that it seemed about the same. She had continued to manage medications but there seemed to be growing  concerns surrounding her forgetting to take them or taking them later in the day. Driving activity had greatly diminished. Performance on a brief cognitive screening instrument (MOCA blind) was 9/22. Ultimately, Ms. Reeb was referred for a comprehensive neuropsychological evaluation to characterize her cognitive abilities and to assist with diagnostic  clarity and treatment planning.   Brain MRI on 02/20/2020 was unremarkable.   Past Medical History:  Diagnosis Date   Acute pain of left knee 12/20/2015   Cervical spondylosis 03/25/2012   MRI 05/2006 IMPRESSION: acute increased pain with abnormal imaging study   Closed fracture of dorsal (thoracic) vertebra 02/12/2012   bone density normal 2013   Early satiety 04/30/2014   Essential hypertension 02/12/2012   The goal for blood pressure is less than 140/90.  If you are checking your blood pressure at home, please record it and bring it to your next office visit. Following the Dietary Approaches to Stop Hypertension (DASH) diet (3 servings of fruit and vegetables daily, whole grains, low sodium, low-fat proteins) and exerci   Fibromyalgia    History of pancreatitis 04/19/2015   03/2010 at [facility] hospital   Hyperlipemia    Hyperlipidemia 08/07/2011   Lipid abnormalities are unchanged. Continue statin, healthy diet and exercise also advised. Lipids will be reassessed in 1 year   Hypothyroidism    Mitral valve prolapse    Myalgia 04/29/2010   Osteopenia 12/08/2014   Bone density test October 2016   Pain in thoracic spine 04/29/2010   Tobacco abuse    Vitamin D deficiency 08/14/2013   Weight loss     Past Surgical History:  Procedure Laterality Date   ABDOMINAL HYSTERECTOMY     LUMBAR DISC SURGERY     x 5    Current Outpatient Medications:    amLODipine (NORVASC) 10 MG tablet, Take 10 mg by mouth daily. Take 1 tab daily, Disp: , Rfl:    atenolol (TENORMIN) 25 MG tablet, Take by mouth daily., Disp: , Rfl:    donepezil (ARICEPT) 10 MG tablet,  Take 1 tablet daily, Disp: 30 tablet, Rfl: 11   hyoscyamine (LEVSIN/SL) 0.125 MG SL tablet, Use one under tongue every 4-6 hours prn RUQ pain prn, Disp: 30 tablet, Rfl: 0   levothyroxine (SYNTHROID, LEVOTHROID) 125 MCG tablet, Take 125 mcg by mouth daily before breakfast. Take 1 tab daily, Disp: , Rfl:    Multiple Vitamin (MULTIVITAMIN) tablet, Take 1 tablet by mouth daily., Disp: , Rfl:    simvastatin (ZOCOR) 20 MG tablet, Take 20 mg by mouth daily., Disp: , Rfl:   Clinical Interview:   The following information was obtained during a clinical interview with Ms. Weitzman and her husband prior to cognitive testing.  Cognitive Symptoms: Decreased short-term memory: Endorsed. Ms. Dalby primarily described difficulties entering rooms and forgetting her original intention. She also described some trouble misplacing or losing objects. Her husband reported more pronounced difficulties surrounding trouble recalling past conversations and repeating herself often. During the current interview, her husband reported progressive decline ongoing for the past six months. However, when meeting with Dr. Karel Jarvis in December 2021, he reported ongoing memory decline for the past 10-12 months, suggesting that concerns have actually been present since late 2020/early 2021. Decreased long-term memory: Denied. Decreased attention/concentration: Denied. Reduced processing speed: Denied. Difficulties with executive functions: Denied. They denied trouble with impulsivity or any significant personality changes.  Difficulties with emotion regulation: Denied. Difficulties with receptive language: Denied. Difficulties with word finding: Denied. Decreased visuoperceptual ability: Denied.  Difficulties completing ADLs: Endorsed. Her husband has fully taken over medication management and will provide medications to Ms. Pehrson when it is time to take them. He stated that she would have difficulty performing these actions if he did not  provide assistance. He took over Landscape architect and bill paying in April 2021.  While Ms. Herold HarmsBeal stated that she could drive very short distances to known locations, her husband stated that he generally performs all the driving due to memory concerns.   Additional Medical History: History of traumatic brain injury/concussion: Denied. History of stroke: Denied. History of seizure activity: Denied. History of known exposure to toxins: Denied. Symptoms of chronic pain: Endorsed. She reported experiencing fairly diffuse "bone pain." Medical records also suggest a history of fibromyalgia.  Experience of frequent headaches/migraines: Denied. Frequent instances of dizziness/vertigo: Endorsed. Symptoms were said to present sporadically and without a known trigger. They will sometimes impact balance, causing her to rely on external sources for support for brief periods of time.   Sensory changes: She wears glasses with benefit and reported symptoms of tinnitus which can be distracting when trying to hear. Other sensory changes/difficulties (e.g., taste or smell) were denied.  Balance/coordination difficulties: Endorsed. She was vague when providing details other than stating that she can be climbing up her stairs, experience some unsteadiness, and have to lean on the wall for support until the experience passes. One side was not said to be more impacted than the other. It is possible that these experiences represent brief dizzy spells. She denied any recent falls.  Other motor difficulties: Denied.  Sleep History: Estimated hours obtained each night: 6-8 hours.  Difficulties falling asleep: Endorsed. She described her sleep as "not good" primarily due to notable difficulties initially falling asleep. However, once she gets asleep, she is able to sleep well.  Difficulties staying asleep: Denied. Feels rested and refreshed upon awakening: Endorsed.  History of snoring: Denied. History of waking up  gasping for air: Denied. Witnessed breath cessation while asleep: Denied.  History of vivid dreaming: Denied. Excessive movement while asleep: Denied. Instances of acting out her dreams: Denied.  Psychiatric/Behavioral Health History: Depression: She described her current mood as "pretty good" and denied to her knowledge a history of mental health concerns or diagnoses. Her husband stated that she has had a short fuse lately and has been more easily irritated. Having a shorter fuse was said to be somewhat longstanding in nature. However, it was described to be "shorter" lately. Current or remote suicidal ideation, intent, or plan was denied.  Anxiety: Denied. Mania: Denied. Trauma History: Denied. Visual/auditory hallucinations: Denied. Delusional thoughts: Denied.  Tobacco: Endorsed. She reported smoking cigarettes and that a current pack will last her about three days.  Alcohol: She denied current alcohol consumption as well as a history of problematic alcohol abuse or dependence.  Recreational drugs: Denied. She did acknowledge a somewhat recent history of marijuana use. Medical records also confirm this.   Family History: Problem Relation Age of Onset   Breast cancer Mother    Heart disease Father    Alzheimer's disease Father        symptom onset late 60s/early 7270s   Asthma Sister    Breast cancer Sister        x 2   Emphysema Paternal Grandfather    Colon cancer Neg Hx    This information was confirmed by Ms. Herold HarmsBeal.  Academic/Vocational History: Highest level of educational attainment: 12 years. She graduated from high school and described herself as an average (C/D) student in academic settings. No relative weaknesses were identified.  History of developmental delay: Denied. History of grade repetition: Denied. Enrollment in special education courses: Denied. History of LD/ADHD: Denied.  Employment: She currently receives disability benefits for past physical ailments.  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   Evaluation Results:   Behavioral Observations: Ms. Nifong was accompanied by her husband, arrived to her appointment on time, and was appropriately dressed and groomed. She appeared alert and oriented. Her gait appeared mildly unsteady but was overall adequate. Gross motor functioning appeared intact upon informal observation and no abnormal movements (e.g., tremors) were noted. Her affect was generally relaxed and positive. Spontaneous speech was fluent and word finding difficulties were not observed during the clinical interview. Thought processes were coherent, organized, and normal in content. Insight into her cognitive difficulties appeared limited in that far greater impairment across numerous domains was observed despite her general denial of difficulties outside of short-term memory. During testing, sustained attention was generally appropriate. The psychometrist did note that Ms. Kook sighed heavily with the introduction of each task. Task engagement was generally adequate and she persisted when challenged. She did require extra clarification or instruction repetition across more complex tasks. Overall, Ms. Sayres was cooperative with the clinical interview and subsequent testing procedures.   Adequacy of Effort: The validity of neuropsychological testing is limited by the extent to which the individual being tested may be assumed to have exerted adequate effort during testing. Ms. Duba expressed her intention to perform to the best of her abilities and exhibited adequate task engagement and persistence. Scores across stand-alone and embedded performance validity measures were variable. However, below expectation performances are likely due to true and quite significant cognitive impairment rather than poor engagement or attempts to perform poorly. As such, the results of the current evaluation are believed to be a generally valid representation  of Ms. Huebsch's current cognitive functioning.  Test Results: Ms. Crothers was poorly oriented at the time of the current evaluation. She incorrectly stated her age ("61") and was unable to state the current month, date, time, or name of the current clinic.   Intellectual abilities based upon educational and vocational attainment were estimated to be in the below average range. Premorbid abilities were estimated to be within the below average range based upon a single-word reading test.   Processing speed was variable, ranging from the exceptionally low to average normative ranges. Basic attention was well below average. More complex attention (e.g., working memory) was exceptionally low to well below average. Executive functioning was exceptionally low. She did perform in the average range across a task assessing safety and judgment.  Assessed receptive language abilities were exceptionally low. She had trouble when asked to sequence commands (i.e., point to X after pointing to Y) and when asked to follow multi-step commands. Assessed expressive language was variable. Phonemic fluency was below average to average, semantic fluency was exceptionally low to average, and confrontation naming was average to above average.      Assessed visuospatial/visuoconstructional abilities were variable, ranging from the exceptionally low to below average normative ranges. Her drawing of a clock was appropriate. Points were lost on her copy of a complex figure due to very mild visual distortions and her omitting one internal aspect altogether.    Learning (i.e., encoding) of novel verbal information was exceptionally low. Spontaneous delayed recall (i.e., retrieval) of previously learned information was also exceptionally low. Retention rates were 0% across a story learning task, 0% across a list learning task, and 0% across a figure drawing task. Performance across recognition tasks was notably below expectation,  suggesting minimal evidence for information consolidation.   Results of emotional screening instruments suggested that recent symptoms of generalized anxiety were in the minimal range, while symptoms of depression  were within normal limits. A screening instrument assessing recent sleep quality suggested the presence of minimal sleep dysfunction.  Tables of Scores:   Note: This summary of test scores accompanies the interpretive report and should not be considered in isolation without reference to the appropriate sections in the text. Descriptors are based on appropriate normative data and may be adjusted based on clinical judgment. Terms such as "Within Normal Limits" and "Outside Normal Limits" are used when a more specific description of the test score cannot be determined.       Percentile - Normative Descriptor > 98 - Exceptionally High 91-97 - Well Above Average 75-90 - Above Average 25-74 - Average 9-24 - Below Average 2-8 - Well Below Average < 2 - Exceptionally Low       Validity:   DESCRIPTOR       Dot Counting Test: --- --- Within Normal Limits  RBANS Effort Index: --- --- Outside Normal Limits  WAIS-IV Reliable Digit Span: --- --- Outside Normal Limits       Orientation:      Raw Score Percentile   NAB Orientation, Form 1 21/29 --- ---       Cognitive Screening:      Raw Score Percentile   SLUMS: 14/30 --- ---       RBANS, Form A: Standard Score/ Scaled Score Percentile   Total Score 50 <1 Exceptionally Low  Immediate Memory 44 <1 Exceptionally Low    List Learning 2 <1 Exceptionally Low    Story Memory 1 <1 Exceptionally Low  Visuospatial/Constructional 66 1 Exceptionally Low    Figure Copy 7 16 Below Average    Line Orientation 6/20 <2 Exceptionally Low  Language 82 12 Below Average    Picture Naming 10/10 51-75 Average    Semantic Fluency 3 1 Exceptionally Low  Attention 53 <1 Exceptionally Low    Digit Span 4 2 Well Below Average    Coding 2 <1  Exceptionally Low  Delayed Memory 40 <1 Exceptionally Low    List Recall 0/10 <2 Exceptionally Low    List Recognition 12/20 <2 Exceptionally Low    Story Recall 1 <1 Exceptionally Low    Story Recognition 8/12 8-15 Below Average    Figure Recall 1 <1 Exceptionally Low    Figure Recognition 1/8 1 Exceptionally Low        Intellectual Functioning:      Standard Score Percentile   Test of Premorbid Functioning: 85 16 Below Average       Attention/Executive Function:     Trail Making Test (TMT): Raw Score (T Score) Percentile     Part A 79 secs.,  0 errors (28) 2 Well Below Average    Part B Discontinued --- Impaired         Scaled Score Percentile   WAIS-IV Digit Span: 2 <1 Exceptionally Low    Forward 5 5 Well Below Average    Backward 5 5 Well Below Average    Sequencing 2 <1 Exceptionally Low        Scaled Score Percentile   WAIS-IV Similarities: 3 1 Exceptionally Low       D-KEFS Color-Word Interference Test: Raw Score (Scaled Score) Percentile     Color Naming 37 secs. (9) 37 Average    Word Reading 26 secs. (10) 50 Average    Inhibition Discontinued --- Impaired    Inhibition/Switching Discontinued --- Impaired       D-KEFS Verbal Fluency Test: Raw Score (Scaled Score) Percentile  Letter Total Correct 27 (8) 25 Average    Category Total Correct 21 (5) 5 Well Below Average    Category Switching Total Correct 5 (2) <1 Exceptionally Low    Category Switching Accuracy 2 (1) <1 Exceptionally Low      Total Set Loss Errors 11 (1) <1 Exceptionally Low      Total Repetition Errors 2 (11) 63 Average       NAB Executive Functions Module, Form 1: T Score Percentile     Judgment 50 50 Average       Language:     Verbal Fluency Test: Raw Score (T Score) Percentile     Phonemic Fluency (FAS) 27 (40) 16 Below Average    Animal Fluency 16 (45) 31 Average        NAB Language Module, Form 1: T Score Percentile     Auditory Comprehension 23 <1 Exceptionally Low    Naming  30/31 (58) 79 Above Average       Visuospatial/Visuoconstruction:      Raw Score Percentile   Clock Drawing: 10/10 --- Within Normal Limits        Scaled Score Percentile   WAIS-IV Block Design: 3 1 Exceptionally Low       Mood and Personality:      Raw Score Percentile   Geriatric Depression Scale: 7 --- Within Normal Limits  Geriatric Anxiety Scale: 1 --- Minimal    Somatic 1 --- Minimal    Cognitive 0 --- Minimal    Affective 0 --- Minimal       Additional Questionnaires:      Raw Score Percentile   PROMIS Sleep Disturbance Questionnaire: 23 --- None to Slight   Informed Consent and Coding/Compliance:   The current evaluation represents a clinical evaluation for the purposes previously outlined by the referral source and is in no way reflective of a forensic evaluation.   Ms. Muthig was provided with a verbal description of the nature and purpose of the present neuropsychological evaluation. Also reviewed were the foreseeable risks and/or discomforts and benefits of the procedure, limits of confidentiality, and mandatory reporting requirements of this provider. The patient was given the opportunity to ask questions and receive answers about the evaluation. Oral consent to participate was provided by the patient.   This evaluation was conducted by Newman Nickels, Ph.D., ABPP-CN, board certified clinical neuropsychologist. Ms. Mezo completed a clinical interview with Dr. Milbert Coulter, billed as one unit [phone], and 110 minutes of cognitive testing and scoring, billed as one unit [phone] and three additional units [id number]. Psychometrist Shan Levans, B.S., assisted Dr. Milbert Coulter with test administration and scoring procedures. As a separate and discrete service, Dr. Milbert Coulter spent a total of 160 minutes in interpretation and report writing billed as one unit [phone] and two units 96133.

## 2021-03-16 ENCOUNTER — Other Ambulatory Visit: Payer: Self-pay

## 2021-03-16 ENCOUNTER — Ambulatory Visit (INDEPENDENT_AMBULATORY_CARE_PROVIDER_SITE_OTHER): Payer: Medicare HMO | Admitting: Psychology

## 2021-03-16 DIAGNOSIS — G309 Alzheimer's disease, unspecified: Secondary | ICD-10-CM | POA: Diagnosis not present

## 2021-03-16 DIAGNOSIS — F0280 Dementia in other diseases classified elsewhere without behavioral disturbance: Secondary | ICD-10-CM

## 2021-03-16 NOTE — Progress Notes (Signed)
° °  Neuropsychology Feedback Session Tillie Rung. [place] Department of Neurology  Reason for Referral:   Beth Blake is a 71 y.o. right-handed Caucasian female referred by Ellouise Newer, M.D., to characterize her current cognitive functioning and assist with diagnostic clarity and treatment planning in the context of subjective cognitive decline and a family history of Alzheimer's disease.   Feedback:   Beth Blake completed a comprehensive neuropsychological evaluation on 03/09/2021. Please refer to that encounter for the full report and recommendations. Briefly, results suggested severe impairment surrounding all aspects of learning and memory. Additional impairment was exhibited across processing speed, attention/concentration, executive functioning, and receptive language. Performance variability was noted across semantic fluency and visuospatial abilities. Regarding etiology, I have primary concerns surrounding Alzheimer's disease as the cause for ongoing impairment. Across memory tasks, Beth Blake was fully amnestic (i.e., 0% retention) across all three memory tasks. She also generally performed poorly across yes/no recognition trials, suggesting the presence of rapid forgetting and an evolving and already quite severe memory storage deficit. Both of these memory findings are hallmark characteristics of Alzheimer's disease. Despite intact confrontation naming, impairments in executive functioning and variability in semantic fluency and visuospatial abilities would represent otherwise fairly typical disease progression. Admittedly, impairment in processing speed and attention/concentration is somewhat more advanced than what is typically seen in early to middle stages of Alzheimer's disease. However, this disease process remains the most likely cause at the present time.   Beth Blake was accompanied by her husband during the current feedback session. Content of the current session  focused on the results of her neuropsychological evaluation. Beth Blake was given the opportunity to ask questions and her questions were answered. She was encouraged to reach out should additional questions arise. A copy of her report was provided at the conclusion of the visit.      18 minutes were spent conducting the current feedback session with Beth Blake, billed as one unit [phone].

## 2021-03-17 ENCOUNTER — Ambulatory Visit
Admission: RE | Admit: 2021-03-17 | Discharge: 2021-03-17 | Disposition: A | Discharge status: Home / Self Care | Payer: Medicare HMO | Source: Ambulatory Visit | Attending: Family Medicine | Admitting: Family Medicine

## 2021-03-17 ENCOUNTER — Other Ambulatory Visit: Payer: Self-pay | Admitting: Family Medicine

## 2021-03-17 DIAGNOSIS — M25562 Pain in left knee: Secondary | ICD-10-CM

## 2021-03-30 ENCOUNTER — Other Ambulatory Visit: Payer: Self-pay

## 2021-03-30 ENCOUNTER — Ambulatory Visit: Payer: Medicare HMO | Admitting: Physician Assistant

## 2021-03-30 ENCOUNTER — Encounter: Payer: Self-pay | Admitting: Physician Assistant

## 2021-03-30 VITALS — BP 171/114 | HR 86 | Resp 18 | Ht 65.0 in | Wt 112.0 lb

## 2021-03-30 DIAGNOSIS — R413 Other amnesia: Secondary | ICD-10-CM

## 2021-03-30 DIAGNOSIS — G309 Alzheimer's disease, unspecified: Secondary | ICD-10-CM | POA: Diagnosis not present

## 2021-03-30 DIAGNOSIS — F0280 Dementia in other diseases classified elsewhere without behavioral disturbance: Secondary | ICD-10-CM | POA: Diagnosis not present

## 2021-03-30 MED ORDER — DONEPEZIL HCL 10 MG PO TABS: ORAL_TABLET | ORAL | 11 refills | Status: DC

## 2021-03-30 NOTE — Progress Notes (Signed)
? ?Assessment/Plan:  ? ?Major neurocognitive disorder due to Alzheimer's Disease  ? ? Recommendations:  ?Discussed safety both in and out of the home.  ?Discussed the importance of regular daily schedule with inclusion of crossword puzzles to maintain brain function.  ?Continue to monitor mood by PCP ?Stay active at least 30 minutes at least 3 times a week.  ?Naps should be scheduled and should be no longer than 60 minutes and should not occur after 2 PM.  ?Mediterranean diet is recommended  ?Control cardiovascular risk factors, especially blood pressure, which today is quite elevated, patient may have forgotten to take the medication. ?Continue donepezil 10 mg daily Side effects were discussed ?Follow up in 6 months. ? ? ?Case discussed with Dr. Karel Jarvis who agrees with the plan ? ? ? ?Subjective:  ? ? ?ALLONA GONDEK is a very pleasant 71 y.o. 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  Neurocognitive testing is suggestive of early stages of Alzheimer's disease. She states that her memory is about the same, "it may be getting a little better ".  She is repeating herself several times during the visit.  She is here with her husband who provides additional information previous records as well as any outside records available were reviewed prior to todays visit.  Patient is currently on donepezil 10 mg daily, tolerating well.  She denies being disoriented.She no longer drives, per husband's request, for safety reasons.  He also manages her medications, as she may forget the taken, as she did today, but she may be taking them later during the day.  He also manages the finances, and most of the cooking.  Her husband denies any personality changes Omkar, paranoia or hallucinations.  She denies any headaches, dizziness, focal numbness, tingling, weakness or falls.  No urinary or bowel complaints.  Sleeps well,  "depending on the dog ".  She feels rested on awakening.  Her mood is good. ?  ?  ?History on Initial Assessment 01/21/2020: This is a 71 year old right-handed woman with a history of hypertension, hyperlipidemia, hypothyroidism presenting for evaluation of memory loss. Her husband is present to provide additional information. She feels her memory is "kinda half and half, sometimes I can't remember nothing." She started noticing changes a couple of months ago, her husband noticed changes 10-12 months ago. She would forget what she went to a room for. She repeats herself several times. She got lost driving one time to pick up her husband from surgery last March. Her husband feels she would not find her car in a big parking lot such as [employer], but does okay with smaller ones. She has left the burner on sometimes. She manages her own medications without issues, her husband agrees. He took over finances in April, she would get confused on what to do and where she is, she was not missing any payments. She has occasional word-finding difficulties. She saw her PCP in July 2021 with an MMSE of 20/30.  Her father had Alzheimer's disease. No history of significant head injuries or alcohol use. ?  ?She denies any headaches, dizziness, diplopia, dysarthria, dysphagia, back pain, focal numbness/tingling/weakness (she has occasional numbness in either hand or foot), bowel/bladder dysfunction. No anosmia, tremors, no falls. She has neck pain. Sleep is good. Mood is good most of the time. Her husband denies any personality changes, no paranoia or hallucinations.  ? ?Neurocognitive testing 03/16/21 Briefly, results suggested  severe impairment surrounding all aspects of learning and memory. Additional impairment was exhibited across processing speed, attention/concentration, executive functioning, and receptive language. Performance variability was noted across semantic fluency and visuospatial abilities. Regarding etiology, I have  primary concerns surrounding Alzheimer's disease as the cause for ongoing impairment. Across memory tasks, Ms. Montreuil was fully amnestic (i.e., 0% retention) across all three memory tasks. She also generally performed poorly across yes/no recognition trials, suggesting the presence of rapid forgetting and an evolving and already quite severe memory storage deficit. Both of these memory findings are hallmark characteristics of Alzheimer's disease. Despite intact confrontation naming, impairments in executive functioning and variability in semantic fluency and visuospatial abilities would represent otherwise fairly typical disease progression. Admittedly, impairment in processing speed and attention/concentration is somewhat more advanced than what is typically seen in early to middle stages of Alzheimer's disease. However, this disease process remains the most likely cause at the present time.  ? ?CURRENT MEDICATIONS:  ?Outpatient Encounter Medications as of 03/30/2021  ?Medication Sig  ? hyoscyamine (LEVSIN/SL) 0.125 MG SL tablet Use one under tongue every 4-6 hours prn RUQ pain prn  ? levothyroxine (SYNTHROID, LEVOTHROID) 125 MCG tablet Take 125 mcg by mouth daily before breakfast. Take 1 tab daily  ? Multiple Vitamin (MULTIVITAMIN) tablet Take 1 tablet by mouth daily.  ? simvastatin (ZOCOR) 20 MG tablet Take 20 mg by mouth daily.  ? [DISCONTINUED] donepezil (ARICEPT) 10 MG tablet Take 1 tablet daily  ? amLODipine (NORVASC) 10 MG tablet Take 10 mg by mouth daily. Take 1 tab daily (Patient not taking: Reported on 03/30/2021)  ? atenolol (TENORMIN) 25 MG tablet Take by mouth daily. (Patient not taking: Reported on 03/30/2021)  ? donepezil (ARICEPT) 10 MG tablet Take 1 tablet daily  ? ?No facility-administered encounter medications on file as of 03/30/2021.  ? ? ? ?Objective:  ?  ? ?PHYSICAL EXAMINATION:   ? ?VITALS:   ?Vitals:  ? 03/30/21 1503  ?BP: (!) 171/114  ?Pulse: 86  ?Resp: 18  ?SpO2: 100%  ?Weight: 112 lb (50.8 kg)   ?Height: 5\' 5"  (1.651 m)  ? ? ?GEN:  The patient appears stated age and is in NAD. ?HEENT:  Normocephalic, atraumatic.  ? ?Neurological examination: ? ?General: NAD, well-groomed, appears stated age. ?Orientation: The patient is alert. Oriented to person, place and date ?Cranial nerves: There is good facial symmetry.The speech is fluent and clear. No aphasia or dysarthria. Fund of knowledge is appropriate. Recent and remote memory are impaired. Attention and concentration are reduced.  Able to name objects and repeat phrases.  Hearing is intact to conversational tone.    ?Sensation: Sensation is intact to light touch throughout ?Motor: Strength is at least antigravity x4. ?Tremors: none  ?DTR's 2/4 in UE/LE  ? ?  ?Montreal Cognitive Assessment  01/21/2020  ?Attention: Read list of digits (0/2) 1  ?Attention: Read list of letters (0/1) 1  ?Attention: Serial 7 subtraction starting at 100 (0/3) 0  ?Language: Repeat phrase (0/2) 0  ?Language : Fluency (0/1) 0  ?Abstraction (0/2) 0  ?Delayed Recall (0/5) 1  ?Orientation (0/6) 5  ? ?No flowsheet data found.  ?No flowsheet data found.  ? ?  ?Movement examination: ?Tone: There is normal tone in the UE/LE ?Abnormal movements:  no tremor.  No myoclonus.  No asterixis.   ?Coordination:  There is no decremation with RAM's. Normal finger to nose  ?Gait and Station: The patient has no difficulty arising out of a deep-seated chair without the use  of the hands. The patient's stride length is good.  Gait is cautious and narrow.  ? ? ?  ?Total time spent on today's visit was 20 minutes, including both face-to-face time and nonface-to-face time. Time included that spent on review of records (prior notes available to me/labs/imaging if pertinent), discussing treatment and goals, answering patient's questions and coordinating care. ? ?Cc:  Mitchell, L.August Saucer, MD ?Marlowe Kays, PA-C   ?

## 2021-03-30 NOTE — Patient Instructions (Signed)
Continue Donepezil 10mg  daily  3. Follow-up in 6 months, call for any changes   FALL PRECAUTIONS: Be cautious when walking. Scan the area for obstacles that may increase the risk of trips and falls. When getting up in the mornings, sit up at the edge of the bed for a few minutes before getting out of bed. Consider elevating the bed at the head end to avoid drop of blood pressure when getting up. Walk always in a well-lit room (use night lights in the walls). Avoid area rugs or power cords from appliances in the middle of the walkways. Use a walker or a cane if necessary and consider physical therapy for balance exercise. Get your eyesight checked regularly.  HOME SAFETY: Consider the safety of the kitchen when operating appliances like stoves, microwave oven, and blender. Consider having supervision and share cooking responsibilities until no longer able to participate in those. Accidents with firearms and other hazards in the house should be identified and addressed as well.  DRIVING: Regarding driving, in patients with progressive memory problems, driving will be impaired. We advise to have someone else do the driving if trouble finding directions or if minor accidents are reported. Independent driving assessment is available to determine safety of driving.  ABILITY TO BE LEFT ALONE: If patient is unable to contact 911 operator, consider using LifeLine, or when the need is there, arrange for someone to stay with patients. Smoking is a fire hazard, consider supervision or cessation. Risk of wandering should be assessed by caregiver and if detected at any point, supervision and safe proof recommendations should be instituted.  MEDICATION SUPERVISION: Inability to self-administer medication needs to be constantly addressed. Implement a mechanism to ensure safe administration of the medications.  RECOMMENDATIONS FOR ALL PATIENTS WITH MEMORY PROBLEMS: 1. Continue to exercise (Recommend 30 minutes of  walking everyday, or 3 hours every week) 2. Increase social interactions - continue going to [place] and enjoy social gatherings with friends and family 3. Eat healthy, avoid fried foods and eat more fruits and vegetables 4. Maintain adequate blood pressure, blood sugar, and blood cholesterol level. Reducing the risk of stroke and cardiovascular disease also helps promoting better memory. 5. Avoid stressful situations. Live a simple life and avoid aggravations. Organize your time and prepare for the next day in anticipation. 6. Sleep well, avoid any interruptions of sleep and avoid any distractions in the bedroom that may interfere with adequate sleep quality 7. Avoid sugar, avoid sweets as there is a strong link between excessive sugar intake, diabetes, and cognitive impairment We discussed the Mediterranean diet, which has been shown to help patients reduce the risk of progressive memory disorders and reduces cardiovascular risk. This includes eating fish, eat fruits and green leafy vegetables, nuts like almonds and hazelnuts, walnuts, and also use olive oil. Avoid fast foods and fried foods as much as possible. Avoid sweets and sugar as sugar use has been linked to worsening of memory function.  There is always a concern of gradual progression of memory problems. If this is the case, then we may need to adjust level of care according to patient needs. Support, both to the patient and caregiver, should then be put into place.       Mediterranean Diet  Why follow it? Research shows Those who follow the Mediterranean diet have a reduced risk of heart disease  The diet is associated with a reduced incidence of Parkinson's and Alzheimer's diseases People following the diet may have longer life expectancies and  lower rates of chronic diseases  The Dietary Guidelines for Americans recommends the Mediterranean diet as an eating plan to promote health and prevent disease  What Is the Mediterranean  Diet?  Healthy eating plan based on typical foods and recipes of Mediterranean-style cooking The diet is primarily a plant based diet; these foods should make up a majority of meals   Starches - Plant based foods should make up a majority of meals - They are an important sources of vitamins, minerals, energy, antioxidants, and fiber - Choose whole grains, foods high in fiber and minimally processed items  - Typical grain sources include wheat, oats, barley, corn, brown rice, bulgar, farro, millet, polenta, couscous  - Various types of beans include chickpeas, lentils, fava beans, black beans, white beans   Fruits  Veggies - Large quantities of antioxidant rich fruits & veggies; 6 or more servings  - Vegetables can be eaten raw or lightly drizzled with oil and cooked  - Vegetables common to the traditional Mediterranean Diet include: artichokes, arugula, beets, broccoli, brussel sprouts, cabbage, carrots, celery, collard greens, cucumbers, eggplant, kale, leeks, lemons, lettuce, mushrooms, okra, onions, peas, peppers, potatoes, pumpkin, radishes, rutabaga, shallots, spinach, sweet potatoes, turnips, zucchini - Fruits common to the Mediterranean Diet include: apples, apricots, avocados, cherries, clementines, dates, figs, grapefruits, grapes, melons, nectarines, oranges, peaches, pears, pomegranates, strawberries, tangerines  Fats - Replace butter and margarine with healthy oils, such as olive oil, canola oil, and tahini  - Limit nuts to no more than a handful a day  - Nuts include walnuts, almonds, pecans, pistachios, pine nuts  - Limit or avoid candied, honey roasted or heavily salted nuts - Olives are central to the [employer] - can be eaten whole or used in a variety of dishes   Meats Protein - Limiting red meat: no more than a few times a month - When eating red meat: choose lean cuts and keep the portion to the size of deck of cards - Eggs: approx. 0 to 4 times a week  - Fish and  lean poultry: at least 2 a week  - Healthy protein sources include, chicken, [place], lean beef, lamb - Increase intake of seafood such as tuna, salmon, trout, mackerel, shrimp, scallops - Avoid or limit high fat processed meats such as sausage and bacon  Dairy - Include moderate amounts of low fat dairy products  - Focus on healthy dairy such as fat free yogurt, skim milk, low or reduced fat cheese - Limit dairy products higher in fat such as whole or 2% milk, cheese, ice cream  Alcohol - Moderate amounts of red wine is ok  - No more than 5 oz daily for women (all ages) and men older than age 3  - No more than 10 oz of wine daily for men younger than 46  Other - Limit sweets and other desserts  - Use herbs and spices instead of salt to flavor foods  - Herbs and spices common to the traditional Mediterranean Diet include: basil, bay leaves, chives, cloves, cumin, fennel, garlic, lavender, marjoram, mint, oregano, parsley, pepper, rosemary, sage, savory, sumac, tarragon, thyme   Its not just a diet, its a lifestyle:  The Mediterranean diet includes lifestyle factors typical of those in the region  Foods, drinks and meals are best eaten with others and savored Daily physical activity is important for overall good health This could be strenuous exercise like running and aerobics This could also be more leisurely activities such as walking,  housework, yard-work, or taking the stairs Moderation is the key; a balanced and healthy diet accommodates most foods and drinks Consider portion sizes and frequency of consumption of certain foods   Meal Ideas & Options:  Breakfast:  Whole wheat toast or whole wheat English muffins with peanut butter & hard boiled egg Steel cut oats topped with apples & cinnamon and skim milk  Fresh fruit: banana, strawberries, melon, berries, peaches  Smoothies: strawberries, bananas, greek yogurt, peanut butter Low fat greek yogurt with blueberries and granola  Egg  white omelet with spinach and mushrooms Breakfast couscous: whole wheat couscous, apricots, skim milk, cranberries  Sandwiches:  Hummus and grilled vegetables (peppers, zucchini, squash) on whole wheat bread   Grilled chicken on whole wheat pita with lettuce, tomatoes, cucumbers or tzatziki  [place] salad on whole wheat bread: tuna salad made with greek yogurt, olives, red peppers, capers, green onions Garlic rosemary lamb pita: lamb sauted with garlic, rosemary, salt & pepper; add lettuce, cucumber, greek yogurt to pita - flavor with lemon juice and black pepper  Seafood:  Mediterranean grilled salmon, seasoned with garlic, basil, parsley, lemon juice and black pepper Shrimp, lemon, and spinach whole-grain pasta salad made with low fat greek yogurt  Seared scallops with lemon orzo  Seared tuna steaks seasoned salt, pepper, coriander topped with tomato mixture of olives, tomatoes, olive oil, minced garlic, parsley, green onions and cappers  Meats:  Herbed greek chicken salad with kalamata olives, cucumber, feta  Red bell peppers stuffed with spinach, bulgur, lean ground beef (or lentils) & topped with feta   Kebabs: skewers of chicken, tomatoes, onions, zucchini, squash  [place] burgers: made with red onions, mint, dill, lemon juice, feta cheese topped with roasted red peppers Vegetarian Cucumber salad: cucumbers, artichoke hearts, celery, red onion, feta cheese, tossed in olive oil & lemon juice  Hummus and whole grain pita points with a greek salad (lettuce, tomato, feta, olives, cucumbers, red onion) Lentil soup with celery, carrots made with vegetable broth, garlic, salt and pepper  Tabouli salad: parsley, bulgur, mint, scallions, cucumbers, tomato, radishes, lemon juice, olive oil, salt and pepper.

## 2021-04-28 DIAGNOSIS — Z23 Encounter for immunization: Secondary | ICD-10-CM | POA: Diagnosis not present

## 2021-04-28 DIAGNOSIS — E46 Unspecified protein-calorie malnutrition: Secondary | ICD-10-CM | POA: Diagnosis not present

## 2021-04-28 DIAGNOSIS — E039 Hypothyroidism, unspecified: Secondary | ICD-10-CM | POA: Diagnosis not present

## 2021-04-28 DIAGNOSIS — I10 Essential (primary) hypertension: Secondary | ICD-10-CM | POA: Diagnosis not present

## 2021-04-28 DIAGNOSIS — R413 Other amnesia: Secondary | ICD-10-CM | POA: Diagnosis not present

## 2021-04-28 DIAGNOSIS — M810 Age-related osteoporosis without current pathological fracture: Secondary | ICD-10-CM | POA: Diagnosis not present

## 2021-05-23 ENCOUNTER — Other Ambulatory Visit: Payer: Self-pay | Admitting: Family Medicine

## 2021-05-23 ENCOUNTER — Ambulatory Visit
Admission: RE | Admit: 2021-05-23 | Discharge: 2021-05-23 | Disposition: A | Discharge status: Home / Self Care | Payer: Medicare HMO | Source: Ambulatory Visit | Attending: Family Medicine | Admitting: Family Medicine

## 2021-05-23 DIAGNOSIS — R079 Chest pain, unspecified: Secondary | ICD-10-CM

## 2021-05-23 DIAGNOSIS — R0789 Other chest pain: Secondary | ICD-10-CM | POA: Diagnosis not present

## 2021-05-23 DIAGNOSIS — R0781 Pleurodynia: Secondary | ICD-10-CM | POA: Diagnosis not present

## 2021-05-23 DIAGNOSIS — R071 Chest pain on breathing: Secondary | ICD-10-CM | POA: Diagnosis not present

## 2021-09-29 ENCOUNTER — Ambulatory Visit: Payer: Medicare HMO | Admitting: Neurology

## 2021-10-04 ENCOUNTER — Encounter: Payer: Self-pay | Admitting: Physician Assistant

## 2021-10-04 ENCOUNTER — Ambulatory Visit: Payer: Medicare HMO | Admitting: Physician Assistant

## 2021-10-04 DIAGNOSIS — Z029 Encounter for administrative examinations, unspecified: Secondary | ICD-10-CM

## 2021-11-16 ENCOUNTER — Other Ambulatory Visit: Payer: Self-pay | Admitting: Family Medicine

## 2021-11-16 DIAGNOSIS — M810 Age-related osteoporosis without current pathological fracture: Secondary | ICD-10-CM | POA: Diagnosis not present

## 2021-11-16 DIAGNOSIS — E7800 Pure hypercholesterolemia, unspecified: Secondary | ICD-10-CM | POA: Diagnosis not present

## 2021-11-16 DIAGNOSIS — I10 Essential (primary) hypertension: Secondary | ICD-10-CM | POA: Diagnosis not present

## 2021-11-16 DIAGNOSIS — Z1231 Encounter for screening mammogram for malignant neoplasm of breast: Secondary | ICD-10-CM

## 2021-11-16 DIAGNOSIS — E039 Hypothyroidism, unspecified: Secondary | ICD-10-CM | POA: Diagnosis not present

## 2021-11-18 ENCOUNTER — Other Ambulatory Visit: Payer: Self-pay | Admitting: Family Medicine

## 2021-11-18 DIAGNOSIS — Z122 Encounter for screening for malignant neoplasm of respiratory organs: Secondary | ICD-10-CM

## 2021-11-18 DIAGNOSIS — F17200 Nicotine dependence, unspecified, uncomplicated: Secondary | ICD-10-CM

## 2021-12-09 ENCOUNTER — Ambulatory Visit: Payer: Medicare HMO | Admitting: Physician Assistant

## 2021-12-09 ENCOUNTER — Encounter: Payer: Self-pay | Admitting: Physician Assistant

## 2021-12-09 VITALS — BP 151/84 | HR 77 | Ht 66.0 in | Wt 112.0 lb

## 2021-12-09 DIAGNOSIS — G309 Alzheimer's disease, unspecified: Secondary | ICD-10-CM

## 2021-12-09 DIAGNOSIS — F0280 Dementia in other diseases classified elsewhere without behavioral disturbance: Secondary | ICD-10-CM

## 2021-12-09 DIAGNOSIS — F0670 Mild neurocognitive disorder due to known physiological condition without behavioral disturbance: Secondary | ICD-10-CM | POA: Diagnosis not present

## 2021-12-09 NOTE — Patient Instructions (Signed)
Continue Donepezil 10mg  daily  3. Follow-up in 6 months, call for any changes   FALL PRECAUTIONS: Be cautious when walking. Scan the area for obstacles that may increase the risk of trips and falls. When getting up in the mornings, sit up at the edge of the bed for a few minutes before getting out of bed. Consider elevating the bed at the head end to avoid drop of blood pressure when getting up. Walk always in a well-lit room (use night lights in the walls). Avoid area rugs or power cords from appliances in the middle of the walkways. Use a walker or a cane if necessary and consider physical therapy for balance exercise. Get your eyesight checked regularly.  HOME SAFETY: Consider the safety of the kitchen when operating appliances like stoves, microwave oven, and blender. Consider having supervision and share cooking responsibilities until no longer able to participate in those. Accidents with firearms and other hazards in the house should be identified and addressed as well.  DRIVING: Regarding driving, in patients with progressive memory problems, driving will be impaired. We advise to have someone else do the driving if trouble finding directions or if minor accidents are reported. Independent driving assessment is available to determine safety of driving.  ABILITY TO BE LEFT ALONE: If patient is unable to contact 911 operator, consider using LifeLine, or when the need is there, arrange for someone to stay with patients. Smoking is a fire hazard, consider supervision or cessation. Risk of wandering should be assessed by caregiver and if detected at any point, supervision and safe proof recommendations should be instituted.  MEDICATION SUPERVISION: Inability to self-administer medication needs to be constantly addressed. Implement a mechanism to ensure safe administration of the medications.  RECOMMENDATIONS FOR ALL PATIENTS WITH MEMORY PROBLEMS: 1. Continue to exercise (Recommend 30 minutes of  walking everyday, or 3 hours every week) 2. Increase social interactions - continue going to [place] and enjoy social gatherings with friends and family 3. Eat healthy, avoid fried foods and eat more fruits and vegetables 4. Maintain adequate blood pressure, blood sugar, and blood cholesterol level. Reducing the risk of stroke and cardiovascular disease also helps promoting better memory. 5. Avoid stressful situations. Live a simple life and avoid aggravations. Organize your time and prepare for the next day in anticipation. 6. Sleep well, avoid any interruptions of sleep and avoid any distractions in the bedroom that may interfere with adequate sleep quality 7. Avoid sugar, avoid sweets as there is a strong link between excessive sugar intake, diabetes, and cognitive impairment We discussed the Mediterranean diet, which has been shown to help patients reduce the risk of progressive memory disorders and reduces cardiovascular risk. This includes eating fish, eat fruits and green leafy vegetables, nuts like almonds and hazelnuts, walnuts, and also use olive oil. Avoid fast foods and fried foods as much as possible. Avoid sweets and sugar as sugar use has been linked to worsening of memory function.  There is always a concern of gradual progression of memory problems. If this is the case, then we may need to adjust level of care according to patient needs. Support, both to the patient and caregiver, should then be put into place.       Mediterranean Diet  Why follow it? Research shows. Those who follow the Mediterranean diet have a reduced risk of heart disease  The diet is associated with a reduced incidence of Parkinson's and Alzheimer's diseases People following the diet may have longer life expectancies and  lower rates of chronic diseases  The Dietary Guidelines for Americans recommends the Mediterranean diet as an eating plan to promote health and prevent disease  What Is the Mediterranean  Diet?  Healthy eating plan based on typical foods and recipes of Mediterranean-style cooking The diet is primarily a plant based diet; these foods should make up a majority of meals   Starches - Plant based foods should make up a majority of meals - They are an important sources of vitamins, minerals, energy, antioxidants, and fiber - Choose whole grains, foods high in fiber and minimally processed items  - Typical grain sources include wheat, oats, barley, corn, brown rice, bulgar, farro, millet, polenta, couscous  - Various types of beans include chickpeas, lentils, fava beans, black beans, white beans   Fruits  Veggies - Large quantities of antioxidant rich fruits & veggies; 6 or more servings  - Vegetables can be eaten raw or lightly drizzled with oil and cooked  - Vegetables common to the traditional Mediterranean Diet include: artichokes, arugula, beets, broccoli, brussel sprouts, cabbage, carrots, celery, collard greens, cucumbers, eggplant, kale, leeks, lemons, lettuce, mushrooms, okra, onions, peas, peppers, potatoes, pumpkin, radishes, rutabaga, shallots, spinach, sweet potatoes, turnips, zucchini - Fruits common to the Mediterranean Diet include: apples, apricots, avocados, cherries, clementines, dates, figs, grapefruits, grapes, melons, nectarines, oranges, peaches, pears, pomegranates, strawberries, tangerines  Fats - Replace butter and margarine with healthy oils, such as olive oil, canola oil, and tahini  - Limit nuts to no more than a handful a day  - Nuts include walnuts, almonds, pecans, pistachios, pine nuts  - Limit or avoid candied, honey roasted or heavily salted nuts - Olives are central to the [employer] - can be eaten whole or used in a variety of dishes   Meats Protein - Limiting red meat: no more than a few times a month - When eating red meat: choose lean cuts and keep the portion to the size of deck of cards - Eggs: approx. 0 to 4 times a week  - Fish and  lean poultry: at least 2 a week  - Healthy protein sources include, chicken, [place], lean beef, lamb - Increase intake of seafood such as tuna, salmon, trout, mackerel, shrimp, scallops - Avoid or limit high fat processed meats such as sausage and bacon  Dairy - Include moderate amounts of low fat dairy products  - Focus on healthy dairy such as fat free yogurt, skim milk, low or reduced fat cheese - Limit dairy products higher in fat such as whole or 2% milk, cheese, ice cream  Alcohol - Moderate amounts of red wine is ok  - No more than 5 oz daily for women (all ages) and men older than age 37  - No more than 10 oz of wine daily for men younger than 66  Other - Limit sweets and other desserts  - Use herbs and spices instead of salt to flavor foods  - Herbs and spices common to the traditional Mediterranean Diet include: basil, bay leaves, chives, cloves, cumin, fennel, garlic, lavender, marjoram, mint, oregano, parsley, pepper, rosemary, sage, savory, sumac, tarragon, thyme   It's not just a diet, it's a lifestyle:  The Mediterranean diet includes lifestyle factors typical of those in the region  Foods, drinks and meals are best eaten with others and savored Daily physical activity is important for overall good health This could be strenuous exercise like running and aerobics This could also be more leisurely activities such as walking,  housework, yard-work, or taking the stairs Moderation is the key; a balanced and healthy diet accommodates most foods and drinks Consider portion sizes and frequency of consumption of certain foods   Meal Ideas & Options:  Breakfast:  Whole wheat toast or whole wheat English muffins with peanut butter & hard boiled egg Steel cut oats topped with apples & cinnamon and skim milk  Fresh fruit: banana, strawberries, melon, berries, peaches  Smoothies: strawberries, bananas, greek yogurt, peanut butter Low fat greek yogurt with blueberries and granola  Egg  white omelet with spinach and mushrooms Breakfast couscous: whole wheat couscous, apricots, skim milk, cranberries  Sandwiches:  Hummus and grilled vegetables (peppers, zucchini, squash) on whole wheat bread   Grilled chicken on whole wheat pita with lettuce, tomatoes, cucumbers or tzatziki  [place] salad on whole wheat bread: tuna salad made with greek yogurt, olives, red peppers, capers, green onions Garlic rosemary lamb pita: lamb sauted with garlic, rosemary, salt & pepper; add lettuce, cucumber, greek yogurt to pita - flavor with lemon juice and black pepper  Seafood:  Mediterranean grilled salmon, seasoned with garlic, basil, parsley, lemon juice and black pepper Shrimp, lemon, and spinach whole-grain pasta salad made with low fat greek yogurt  Seared scallops with lemon orzo  Seared tuna steaks seasoned salt, pepper, coriander topped with tomato mixture of olives, tomatoes, olive oil, minced garlic, parsley, green onions and cappers  Meats:  Herbed greek chicken salad with kalamata olives, cucumber, feta  Red bell peppers stuffed with spinach, bulgur, lean ground beef (or lentils) & topped with feta   Kebabs: skewers of chicken, tomatoes, onions, zucchini, squash  [place] burgers: made with red onions, mint, dill, lemon juice, feta cheese topped with roasted red peppers Vegetarian Cucumber salad: cucumbers, artichoke hearts, celery, red onion, feta cheese, tossed in olive oil & lemon juice  Hummus and whole grain pita points with a greek salad (lettuce, tomato, feta, olives, cucumbers, red onion) Lentil soup with celery, carrots made with vegetable broth, garlic, salt and pepper  Tabouli salad: parsley, bulgur, mint, scallions, cucumbers, tomato, radishes, lemon juice, olive oil, salt and pepper.

## 2021-12-09 NOTE — Progress Notes (Signed)
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 RH female with a history of hypertension, hyperlipidemia, hypothyroidism, and a history of  mild neurocognitive disorder  likely due to early stages of Alzheimer's disease seen today in follow up for memory loss. Patient is currently o donepezil 10 mg daily .  Today's MMSE 17/30, essentially stable from prior.  Recommendations  Follow up in  6 months. Continue donepezil 10 mg daily, side effects discussed Continue to control cardiovascular risk factors To control mood as per PCP    Subjective:    This patient is accompanied in the office by her husband who supplements the history.  Previous records as well as any outside records available were reviewed prior to todays visit. Lat seen on 03/30/2021    Any changes in memory since last visit?  Patient denies any changes, although her husband reports that her memory may not be as good as before, she continues to forget conversations, she may forget people's names that she just meets, but she does not know the names of people that she is familiar with.  She has not been doing a lot of crossword puzzles or word finding lately, but she does read extensively. repeats oneself?  Endorsed.  During this visit she repeats herself several times. Disoriented when walking into a room?  Patient denies being disoriented, only wandering at times "what did I come here for?  " Leaving objects in unusual places?  Patient denies   Ambulates  with difficulty?   Patient denies   Recent falls?  Patient denies   Any head injuries?  Patient denies   History of seizures?   Patient denies   Wandering behavior?  Patient denies   Patient drives?   Patient no longer drives  Any mood changes since last visit?  She gets aggravated at times. Any worsening depression?:  Patient denies   Hallucinations?  Patient denies   Paranoia?  Patient  denies   Patient reports that sleeps well without vivid dreams, REM behavior or sleepwalking   History of sleep apnea?  Patient denies   Any hygiene concerns?  Patient denies   Independent of bathing and dressing?  Endorsed  Does the patient needs help with medications?  Husband in charge  Who is in charge of the finances?  Husband is in charge   Any changes in appetite?  Patient denies   Patient have trouble swallowing? Patient denies   Does the patient cook?  Patient denies   Any kitchen accidents such as leaving the stove on? Patient denies   Any headaches?  Patient denies   Double vision? Patient denies   Any focal numbness or tingling?  Patient denies   Chronic back pain Patient denies   Unilateral weakness?  Patient denies   Any tremors?  Patient denies   Any history of anosmia?  Patient denies   Any incontinence of urine?  Patient denies   Any bowel dysfunction?   Patient denies      Patient lives with: husband     History on Initial Assessment 01/21/2020: This is a 71 year old right-handed woman with a history of hypertension, hyperlipidemia, hypothyroidism presenting for evaluation of memory loss. Her husband is present to provide additional information. She feels her memory is "kinda half and half, sometimes I can't remember nothing." She started noticing changes a couple of months ago, her husband noticed changes 10-12 months ago.  She would forget what she went to a room for. She repeats herself several times. She got lost driving one time to pick up her husband from surgery last March. Her husband feels she would not find her car in a big parking lot such as [employer], but does okay with smaller ones. She has left the burner on sometimes. She manages her own medications without issues, her husband agrees. He took over finances in April, she would get confused on what to do and where she is, she was not missing any payments. She has occasional word-finding difficulties. She saw her  PCP in July 2021 with an MMSE of 20/30.  Her father had Alzheimer's disease. No history of significant head injuries or alcohol use.   She denies any headaches, dizziness, diplopia, dysarthria, dysphagia, back pain, focal numbness/tingling/weakness (she has occasional numbness in either hand or foot), bowel/bladder dysfunction. No anosmia, tremors, no falls. She has neck pain. Sleep is good. Mood is good most of the time. Her husband denies any personality changes, no paranoia or hallucinations.    Neurocognitive testing 03/16/21 Briefly, results suggested severe impairment surrounding all aspects of learning and memory. Additional impairment was exhibited across processing speed, attention/concentration, executive functioning, and receptive language. Performance variability was noted across semantic fluency and visuospatial abilities. Regarding etiology, I have primary concerns surrounding Alzheimer's disease as the cause for ongoing impairment. Across memory tasks, Ms. Herbold was fully amnestic (i.e., 0% retention) across all three memory tasks. She also generally performed poorly across yes/no recognition trials, suggesting the presence of rapid forgetting and an evolving and already quite severe memory storage deficit. Both of these memory findings are hallmark characteristics of Alzheimer's disease. Despite intact confrontation naming, impairments in executive functioning and variability in semantic fluency and visuospatial abilities would represent otherwise fairly typical disease progression. Admittedly, impairment in processing speed and attention/concentration is somewhat more advanced than what is typically seen in early to middle stages of Alzheimer's disease. However, this disease process remains the most likely cause at the present time.    PREVIOUS MEDICATIONS:   CURRENT MEDICATIONS:  Outpatient Encounter Medications as of 12/09/2021  Medication Sig   amLODipine (NORVASC) 10 MG tablet Take 10  mg by mouth daily. Take 1 tab daily   atenolol (TENORMIN) 25 MG tablet Take by mouth daily.   donepezil (ARICEPT) 10 MG tablet Take 1 tablet daily   levothyroxine (SYNTHROID, LEVOTHROID) 125 MCG tablet Take 125 mcg by mouth daily before breakfast. Take 1 tab daily   Multiple Vitamin (MULTIVITAMIN) tablet Take 1 tablet by mouth daily.   simvastatin (ZOCOR) 20 MG tablet Take 20 mg by mouth daily.   hyoscyamine (LEVSIN/SL) 0.125 MG SL tablet Use one under tongue every 4-6 hours prn RUQ pain prn (Patient not taking: Reported on 12/09/2021)   No facility-administered encounter medications on file as of 12/09/2021.       12/09/2021    3:00 PM  MMSE - Mini Mental State Exam  Orientation to time 0  Orientation to Place 5  Registration 3  Attention/ Calculation 0  Recall 1  Language- name 2 objects 2  Language- repeat 1  Language- follow 3 step command 3  Language- read & follow direction 1  Write a sentence 1  Copy design 0  Total score 17      01/21/2020    4:00 PM  Montreal Cognitive Assessment   Attention: Read list of digits (0/2) 1  Attention: Read list of letters (0/1) 1  Attention: Serial  7 subtraction starting at 100 (0/3) 0  Language: Repeat phrase (0/2) 0  Language : Fluency (0/1) 0  Abstraction (0/2) 0  Delayed Recall (0/5) 1  Orientation (0/6) 5    Objective:     PHYSICAL EXAMINATION:    VITALS:   Vitals:   12/09/21 1526  BP: (!) 151/84  Pulse: 77  SpO2: 100%  Weight: 112 lb (50.8 kg)  Height: 5\' 6"  (1.676 m)    GEN:  The patient appears stated age and is in NAD. HEENT:  Normocephalic, atraumatic.   Neurological examination:  General: NAD, well-groomed, appears stated age. Orientation: The patient is alert. Oriented to person, place and date Cranial nerves: There is good facial symmetry.The speech is fluent and clear. No aphasia or dysarthria. Fund of knowledge is appropriate. Recent memory is impaired, remote memory is normal. Attention and  concentration are normal   Able to name objects and repeat phrases.  Hearing is intact to conversational tone.    Sensation: Sensation is intact to light touch throughout Motor: Strength is at least antigravity x4. Tremors: none  DTR's 2/4 in UE/LE     Movement examination: Tone: There is normal tone in the UE/LE Abnormal movements:  no tremor.  No myoclonus.  No asterixis.   Coordination:  There is no decremation with RAM's. Normal finger to nose  Gait and Station: The patient has no difficulty arising out of a deep-seated chair without the use of the hands. The patient's stride length is good.  Gait is cautious and narrow.    Thank you for allowing the opportunity to participate in the care of this nice patient. Please do not hesitate to contact us for any questions or concerns.   Total time spent on today's visit was 21 minutes dedicated to this patient today, preparing to see patient, examining the patient, ordering tests and/or medications and counseling the patient, documenting clinical information in the EHR or other health record, independently interpreting results and communicating results to the patient/family, discussing treatment and goals, answering patient's questions and coordinating care.  Cc:  [place], MD  Irven Coe 12/09/2021 3:56 PM

## 2021-12-29 ENCOUNTER — Ambulatory Visit
Admission: RE | Admit: 2021-12-29 | Discharge: 2021-12-29 | Disposition: A | Discharge status: Home / Self Care | Payer: Medicare HMO | Source: Ambulatory Visit | Attending: Family Medicine | Admitting: Family Medicine

## 2021-12-29 DIAGNOSIS — F17210 Nicotine dependence, cigarettes, uncomplicated: Secondary | ICD-10-CM | POA: Diagnosis not present

## 2021-12-29 DIAGNOSIS — F17200 Nicotine dependence, unspecified, uncomplicated: Secondary | ICD-10-CM

## 2021-12-29 DIAGNOSIS — Z122 Encounter for screening for malignant neoplasm of respiratory organs: Secondary | ICD-10-CM

## 2022-01-04 ENCOUNTER — Ambulatory Visit
Admission: RE | Admit: 2022-01-04 | Discharge: 2022-01-04 | Disposition: A | Discharge status: Home / Self Care | Payer: Medicare HMO | Source: Ambulatory Visit | Attending: Family Medicine | Admitting: Family Medicine

## 2022-01-04 DIAGNOSIS — Z1231 Encounter for screening mammogram for malignant neoplasm of breast: Secondary | ICD-10-CM

## 2022-01-06 DIAGNOSIS — E7800 Pure hypercholesterolemia, unspecified: Secondary | ICD-10-CM | POA: Diagnosis not present

## 2022-01-06 DIAGNOSIS — E039 Hypothyroidism, unspecified: Secondary | ICD-10-CM | POA: Diagnosis not present

## 2022-02-15 DIAGNOSIS — D124 Benign neoplasm of descending colon: Secondary | ICD-10-CM | POA: Diagnosis not present

## 2022-02-15 DIAGNOSIS — Z1211 Encounter for screening for malignant neoplasm of colon: Secondary | ICD-10-CM | POA: Diagnosis not present

## 2022-02-15 DIAGNOSIS — D120 Benign neoplasm of cecum: Secondary | ICD-10-CM | POA: Diagnosis not present

## 2022-02-15 DIAGNOSIS — K648 Other hemorrhoids: Secondary | ICD-10-CM | POA: Diagnosis not present

## 2022-02-15 DIAGNOSIS — D123 Benign neoplasm of transverse colon: Secondary | ICD-10-CM | POA: Diagnosis not present

## 2022-02-15 DIAGNOSIS — D122 Benign neoplasm of ascending colon: Secondary | ICD-10-CM | POA: Diagnosis not present

## 2022-02-17 DIAGNOSIS — D120 Benign neoplasm of cecum: Secondary | ICD-10-CM | POA: Diagnosis not present

## 2022-02-17 DIAGNOSIS — D122 Benign neoplasm of ascending colon: Secondary | ICD-10-CM | POA: Diagnosis not present

## 2022-02-17 DIAGNOSIS — D123 Benign neoplasm of transverse colon: Secondary | ICD-10-CM | POA: Diagnosis not present

## 2022-04-12 DIAGNOSIS — G309 Alzheimer's disease, unspecified: Secondary | ICD-10-CM | POA: Diagnosis not present

## 2022-04-12 DIAGNOSIS — F0280 Dementia in other diseases classified elsewhere without behavioral disturbance: Secondary | ICD-10-CM | POA: Diagnosis not present

## 2022-04-12 DIAGNOSIS — E46 Unspecified protein-calorie malnutrition: Secondary | ICD-10-CM | POA: Diagnosis not present

## 2022-04-12 DIAGNOSIS — E7800 Pure hypercholesterolemia, unspecified: Secondary | ICD-10-CM | POA: Diagnosis not present

## 2022-04-12 DIAGNOSIS — I10 Essential (primary) hypertension: Secondary | ICD-10-CM | POA: Diagnosis not present

## 2022-04-12 DIAGNOSIS — B029 Zoster without complications: Secondary | ICD-10-CM | POA: Diagnosis not present

## 2022-04-12 DIAGNOSIS — J439 Emphysema, unspecified: Secondary | ICD-10-CM | POA: Diagnosis not present

## 2022-05-04 DIAGNOSIS — Z5919 Other inadequate housing: Secondary | ICD-10-CM | POA: Diagnosis not present

## 2022-05-04 DIAGNOSIS — E785 Hyperlipidemia, unspecified: Secondary | ICD-10-CM | POA: Diagnosis not present

## 2022-05-04 DIAGNOSIS — F02A0 Dementia in other diseases classified elsewhere, mild, without behavioral disturbance, psychotic disturbance, mood disturbance, and anxiety: Secondary | ICD-10-CM | POA: Diagnosis not present

## 2022-05-04 DIAGNOSIS — Z7983 Long term (current) use of bisphosphonates: Secondary | ICD-10-CM | POA: Diagnosis not present

## 2022-05-04 DIAGNOSIS — Z681 Body mass index (BMI) 19 or less, adult: Secondary | ICD-10-CM | POA: Diagnosis not present

## 2022-05-04 DIAGNOSIS — F17210 Nicotine dependence, cigarettes, uncomplicated: Secondary | ICD-10-CM | POA: Diagnosis not present

## 2022-05-04 DIAGNOSIS — I951 Orthostatic hypotension: Secondary | ICD-10-CM | POA: Diagnosis not present

## 2022-05-04 DIAGNOSIS — E039 Hypothyroidism, unspecified: Secondary | ICD-10-CM | POA: Diagnosis not present

## 2022-05-04 DIAGNOSIS — R636 Underweight: Secondary | ICD-10-CM | POA: Diagnosis not present

## 2022-05-04 DIAGNOSIS — I10 Essential (primary) hypertension: Secondary | ICD-10-CM | POA: Diagnosis not present

## 2022-05-04 DIAGNOSIS — M810 Age-related osteoporosis without current pathological fracture: Secondary | ICD-10-CM | POA: Diagnosis not present

## 2022-05-31 ENCOUNTER — Other Ambulatory Visit: Payer: Self-pay

## 2022-05-31 ENCOUNTER — Telehealth: Payer: Self-pay | Admitting: Physician Assistant

## 2022-05-31 DIAGNOSIS — R413 Other amnesia: Secondary | ICD-10-CM

## 2022-05-31 MED ORDER — DONEPEZIL HCL 10 MG PO TABS: ORAL_TABLET | ORAL | 2 refills | Status: DC

## 2022-05-31 NOTE — Telephone Encounter (Signed)
Pt's husband called in and left a message with the access nurse. He stated his wife is out of donepezil. The pharmacy has not heard back from [place]. She has been out a couple of weeks. He did not specify the pharmacy.

## 2022-05-31 NOTE — Telephone Encounter (Signed)
Called patient no answer called patients meds into the pharmacy

## 2022-06-06 ENCOUNTER — Other Ambulatory Visit: Payer: Self-pay | Admitting: Family Medicine

## 2022-06-06 DIAGNOSIS — R911 Solitary pulmonary nodule: Secondary | ICD-10-CM

## 2022-06-27 ENCOUNTER — Encounter: Payer: Self-pay | Admitting: Physician Assistant

## 2022-06-27 ENCOUNTER — Ambulatory Visit: Payer: Medicare HMO | Admitting: Physician Assistant

## 2022-06-27 DIAGNOSIS — Z029 Encounter for administrative examinations, unspecified: Secondary | ICD-10-CM

## 2022-07-20 ENCOUNTER — Encounter: Payer: Self-pay | Admitting: Physician Assistant

## 2022-07-20 ENCOUNTER — Ambulatory Visit: Payer: Medicare HMO | Admitting: Physician Assistant

## 2022-07-20 VITALS — BP 140/88 | HR 64 | Resp 20 | Ht 66.0 in | Wt 119.0 lb

## 2022-07-20 DIAGNOSIS — G309 Alzheimer's disease, unspecified: Secondary | ICD-10-CM | POA: Diagnosis not present

## 2022-07-20 DIAGNOSIS — R413 Other amnesia: Secondary | ICD-10-CM

## 2022-07-20 DIAGNOSIS — F0280 Dementia in other diseases classified elsewhere without behavioral disturbance: Secondary | ICD-10-CM | POA: Diagnosis not present

## 2022-07-20 MED ORDER — DONEPEZIL HCL 10 MG PO TABS: ORAL_TABLET | ORAL | 3 refills | Status: DC

## 2022-07-20 NOTE — Patient Instructions (Signed)
Continue Donepezil 10mg daily  3. Follow-up in 6 months, call for any changes   FALL PRECAUTIONS: Be cautious when walking. Scan the area for obstacles that may increase the risk of trips and falls. When getting up in the mornings, sit up at the edge of the bed for a few minutes before getting out of bed. Consider elevating the bed at the head end to avoid drop of blood pressure when getting up. Walk always in a well-lit room (use night lights in the walls). Avoid area rugs or power cords from appliances in the middle of the walkways. Use a walker or a cane if necessary and consider physical therapy for balance exercise. Get your eyesight checked regularly.  HOME SAFETY: Consider the safety of the kitchen when operating appliances like stoves, microwave oven, and blender. Consider having supervision and share cooking responsibilities until no longer able to participate in those. Accidents with firearms and other hazards in the house should be identified and addressed as well.  DRIVING: Regarding driving, in patients with progressive memory problems, driving will be impaired. We advise to have someone else do the driving if trouble finding directions or if minor accidents are reported. Independent driving assessment is available to determine safety of driving.  ABILITY TO BE LEFT ALONE: If patient is unable to contact 911 operator, consider using LifeLine, or when the need is there, arrange for someone to stay with patients. Smoking is a fire hazard, consider supervision or cessation. Risk of wandering should be assessed by caregiver and if detected at any point, supervision and safe proof recommendations should be instituted.  MEDICATION SUPERVISION: Inability to self-administer medication needs to be constantly addressed. Implement a mechanism to ensure safe administration of the medications.  RECOMMENDATIONS FOR ALL PATIENTS WITH MEMORY PROBLEMS: 1. Continue to exercise (Recommend 30 minutes of  walking everyday, or 3 hours every week) 2. Increase social interactions - continue going to Church and enjoy social gatherings with friends and family 3. Eat healthy, avoid fried foods and eat more fruits and vegetables 4. Maintain adequate blood pressure, blood sugar, and blood cholesterol level. Reducing the risk of stroke and cardiovascular disease also helps promoting better memory. 5. Avoid stressful situations. Live a simple life and avoid aggravations. Organize your time and prepare for the next day in anticipation. 6. Sleep well, avoid any interruptions of sleep and avoid any distractions in the bedroom that may interfere with adequate sleep quality 7. Avoid sugar, avoid sweets as there is a strong link between excessive sugar intake, diabetes, and cognitive impairment We discussed the Mediterranean diet, which has been shown to help patients reduce the risk of progressive memory disorders and reduces cardiovascular risk. This includes eating fish, eat fruits and green leafy vegetables, nuts like almonds and hazelnuts, walnuts, and also use olive oil. Avoid fast foods and fried foods as much as possible. Avoid sweets and sugar as sugar use has been linked to worsening of memory function.  There is always a concern of gradual progression of memory problems. If this is the case, then we may need to adjust level of care according to patient needs. Support, both to the patient and caregiver, should then be put into place.       Mediterranean Diet  Why follow it? Research shows. Those who follow the Mediterranean diet have a reduced risk of heart disease  The diet is associated with a reduced incidence of Parkinson's and Alzheimer's diseases People following the diet may have longer life expectancies and   lower rates of chronic diseases  The Dietary Guidelines for Americans recommends the Mediterranean diet as an eating plan to promote health and prevent disease  What Is the Mediterranean  Diet?  Healthy eating plan based on typical foods and recipes of Mediterranean-style cooking The diet is primarily a plant based diet; these foods should make up a majority of meals   Starches - Plant based foods should make up a majority of meals - They are an important sources of vitamins, minerals, energy, antioxidants, and fiber - Choose whole grains, foods high in fiber and minimally processed items  - Typical grain sources include wheat, oats, barley, corn, brown rice, bulgar, farro, millet, polenta, couscous  - Various types of beans include chickpeas, lentils, fava beans, black beans, white beans   Fruits  Veggies - Large quantities of antioxidant rich fruits & veggies; 6 or more servings  - Vegetables can be eaten raw or lightly drizzled with oil and cooked  - Vegetables common to the traditional Mediterranean Diet include: artichokes, arugula, beets, broccoli, brussel sprouts, cabbage, carrots, celery, collard greens, cucumbers, eggplant, kale, leeks, lemons, lettuce, mushrooms, okra, onions, peas, peppers, potatoes, pumpkin, radishes, rutabaga, shallots, spinach, sweet potatoes, turnips, zucchini - Fruits common to the Mediterranean Diet include: apples, apricots, avocados, cherries, clementines, dates, figs, grapefruits, grapes, melons, nectarines, oranges, peaches, pears, pomegranates, strawberries, tangerines  Fats - Replace butter and margarine with healthy oils, such as olive oil, canola oil, and tahini  - Limit nuts to no more than a handful a day  - Nuts include walnuts, almonds, pecans, pistachios, pine nuts  - Limit or avoid candied, honey roasted or heavily salted nuts - Olives are central to the Mediterranean diet - can be eaten whole or used in a variety of dishes   Meats Protein - Limiting red meat: no more than a few times a month - When eating red meat: choose lean cuts and keep the portion to the size of deck of cards - Eggs: approx. 0 to 4 times a week  - Fish and  lean poultry: at least 2 a week  - Healthy protein sources include, chicken, turkey, lean beef, lamb - Increase intake of seafood such as tuna, salmon, trout, mackerel, shrimp, scallops - Avoid or limit high fat processed meats such as sausage and bacon  Dairy - Include moderate amounts of low fat dairy products  - Focus on healthy dairy such as fat free yogurt, skim milk, low or reduced fat cheese - Limit dairy products higher in fat such as whole or 2% milk, cheese, ice cream  Alcohol - Moderate amounts of red wine is ok  - No more than 5 oz daily for women (all ages) and men older than age 65  - No more than 10 oz of wine daily for men younger than 65  Other - Limit sweets and other desserts  - Use herbs and spices instead of salt to flavor foods  - Herbs and spices common to the traditional Mediterranean Diet include: basil, bay leaves, chives, cloves, cumin, fennel, garlic, lavender, marjoram, mint, oregano, parsley, pepper, rosemary, sage, savory, sumac, tarragon, thyme   It's not just a diet, it's a lifestyle:  The Mediterranean diet includes lifestyle factors typical of those in the region  Foods, drinks and meals are best eaten with others and savored Daily physical activity is important for overall good health This could be strenuous exercise like running and aerobics This could also be more leisurely activities such as walking,   housework, yard-work, or taking the stairs Moderation is the key; a balanced and healthy diet accommodates most foods and drinks Consider portion sizes and frequency of consumption of certain foods   Meal Ideas & Options:  Breakfast:  Whole wheat toast or whole wheat English muffins with peanut butter & hard boiled egg Steel cut oats topped with apples & cinnamon and skim milk  Fresh fruit: banana, strawberries, melon, berries, peaches  Smoothies: strawberries, bananas, greek yogurt, peanut butter Low fat greek yogurt with blueberries and granola  Egg  white omelet with spinach and mushrooms Breakfast couscous: whole wheat couscous, apricots, skim milk, cranberries  Sandwiches:  Hummus and grilled vegetables (peppers, zucchini, squash) on whole wheat bread   Grilled chicken on whole wheat pita with lettuce, tomatoes, cucumbers or tzatziki  Tuna salad on whole wheat bread: tuna salad made with greek yogurt, olives, red peppers, capers, green onions Garlic rosemary lamb pita: lamb sauted with garlic, rosemary, salt & pepper; add lettuce, cucumber, greek yogurt to pita - flavor with lemon juice and black pepper  Seafood:  Mediterranean grilled salmon, seasoned with garlic, basil, parsley, lemon juice and black pepper Shrimp, lemon, and spinach whole-grain pasta salad made with low fat greek yogurt  Seared scallops with lemon orzo  Seared tuna steaks seasoned salt, pepper, coriander topped with tomato mixture of olives, tomatoes, olive oil, minced garlic, parsley, green onions and cappers  Meats:  Herbed greek chicken salad with kalamata olives, cucumber, feta  Red bell peppers stuffed with spinach, bulgur, lean ground beef (or lentils) & topped with feta   Kebabs: skewers of chicken, tomatoes, onions, zucchini, squash  Turkey burgers: made with red onions, mint, dill, lemon juice, feta cheese topped with roasted red peppers Vegetarian Cucumber salad: cucumbers, artichoke hearts, celery, red onion, feta cheese, tossed in olive oil & lemon juice  Hummus and whole grain pita points with a greek salad (lettuce, tomato, feta, olives, cucumbers, red onion) Lentil soup with celery, carrots made with vegetable broth, garlic, salt and pepper  Tabouli salad: parsley, bulgur, mint, scallions, cucumbers, tomato, radishes, lemon juice, olive oil, salt and pepper. 

## 2022-07-20 NOTE — Progress Notes (Signed)
Assessment/Plan:   Dementia likely due to Alzheimer's disease  Beth Blake is a very pleasant 72 y.o. 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 Patient is currently on donepezil 10 mg daily. Memory is stable. MMSE today is 15/30.     Follow up in  6 months. Continue donepezil 10 mg daily. Side effects were discussed  Recommend good control of her cardiovascular risk factors Continue to control mood as per PCP    Subjective:    This patient is accompanied in the office by her husband who supplements the history.  Previous records as well as any outside records available were reviewed prior to todays visit. Patient was last seen on 12/09/2021 with MMSE of 17/30    Any changes in memory since last visit?  "Her husband reports that her memory may not be quite as good as before".  She has some difficulty with conversations and may forget people's names that she just needs.  She has been doing a lot of crossword puzzles and word. Likes to watch mysteries shows.  repeats oneself?  Endorsed, and during this visit, she does repeat herself frequently Disoriented when walking into a room?  Patient denies    Leaving objects in unusual places? denies   Wandering behavior? denies   Any personality changes since last visit?  As before, she has moments of irritability. Any worsening depression?:  denies   Hallucinations or paranoia?  denies   Seizures?    denies    Any sleep changes?  Sleeps well.  Denies vivid dreams, REM behavior or sleepwalking   Sleep apnea?   denies   Any hygiene concerns? denies   Independent of bathing and dressing?  Endorsed  Does the patient needs help with medications?  Husband is in charge  Who is in charge of the finances?  Husband is in charge    Any changes in appetite?   denies    Patient have trouble swallowing?  denies   Does the patient cook?  No  Any headaches?   denies   Chronic back pain  denies   Ambulates with difficulty?  denies   Recent falls or head injuries? denies     Unilateral weakness, numbness or tingling? denies   Any tremors?  denies   Any anosmia?  Patient denies   Any incontinence of urine?  "Sometimes" Any bowel dysfunction?  denies      Patient lives with her husband  Does the patient drive? No longer drives       History on Initial Assessment 01/21/2020: This is a 72 year old right-handed woman with a history of hypertension, hyperlipidemia, hypothyroidism presenting for evaluation of memory loss. Her husband is present to provide additional information. She feels her memory is "kinda half and half, sometimes I can't remember nothing." She started noticing changes a couple of months ago, her husband noticed changes 10-12 months ago. She would forget what she went to a room for. She repeats herself several times. She got lost driving one time to pick up her husband from surgery last March. Her husband feels she would not find her car in a big parking lot such as [employer], but does okay with smaller ones. She has left the burner on sometimes. She manages her own medications without issues, her husband agrees. He took over  finances in April, she would get confused on what to do and where she is, she was not missing any payments. She has occasional word-finding difficulties. She saw her PCP in July 2021 with an MMSE of 20/30.  Her father had Alzheimer's disease. No history of significant head injuries or alcohol use.   She denies any headaches, dizziness, diplopia, dysarthria, dysphagia, back pain, focal numbness/tingling/weakness (she has occasional numbness in either hand or foot), bowel/bladder dysfunction. No anosmia, tremors, no falls. She has neck pain. Sleep is good. Mood is good most of the time. Her husband denies any personality  changes, no paranoia or hallucinations.    Neurocognitive testing 03/16/21 Briefly, results suggested severe impairment surrounding all aspects of learning and memory. Additional impairment was exhibited across processing speed, attention/concentration, executive functioning, and receptive language. Performance variability was noted across semantic fluency and visuospatial abilities. Regarding etiology, I have primary concerns surrounding Alzheimer's disease as the cause for ongoing impairment. Across memory tasks, Beth Blake was fully amnestic (i.e., 0% retention) across all three memory tasks. She also generally performed poorly across yes/no recognition trials, suggesting the presence of rapid forgetting and an evolving and already quite severe memory storage deficit. Both of these memory findings are hallmark characteristics of Alzheimer's disease. Despite intact confrontation naming, impairments in executive functioning and variability in semantic fluency and visuospatial abilities would represent otherwise fairly typical disease progression. Admittedly, impairment in processing speed and attention/concentration is somewhat more advanced than what is typically seen in early to middle stages of Alzheimer's disease. However, this disease process remains the most likely cause at the present time.    PREVIOUS MEDICATIONS:   CURRENT MEDICATIONS:  Outpatient Encounter Medications as of 07/20/2022  Medication Sig   amLODipine (NORVASC) 10 MG tablet Take 10 mg by mouth daily. Take 1 tab daily   atenolol (TENORMIN) 25 MG tablet Take by mouth daily.   levothyroxine (SYNTHROID, LEVOTHROID) 125 MCG tablet Take 125 mcg by mouth daily before breakfast. Take 1 tab daily   Multiple Vitamin (MULTIVITAMIN) tablet Take 1 tablet by mouth daily.   simvastatin (ZOCOR) 20 MG tablet Take 20 mg by mouth daily.   [DISCONTINUED] donepezil (ARICEPT) 10 MG tablet Take 1 tablet daily   donepezil (ARICEPT) 10 MG tablet Take 1  tablet daily   hyoscyamine (LEVSIN/SL) 0.125 MG SL tablet Use one under tongue every 4-6 hours prn RUQ pain prn (Patient not taking: Reported on 12/09/2021)   No facility-administered encounter medications on file as of 07/20/2022.       07/20/2022    6:00 PM 12/09/2021    3:00 PM  MMSE - Mini Mental State Exam  Orientation to time 0 0  Orientation to Place 4 5  Registration 3 3  Attention/ Calculation 0 0  Recall 0 1  Language- name 2 objects 2 2  Language- repeat 1 1  Language- follow 3 step command 3 3  Language- read & follow direction 1 1  Write a sentence 1 1  Copy design 0 0  Total score 15 17      01/21/2020    4:00 PM  Montreal Cognitive Assessment   Attention: Read list of digits (0/2) 1  Attention: Read list of letters (0/1) 1  Attention: Serial 7 subtraction starting at 100 (0/3) 0  Language: Repeat phrase (0/2) 0  Language : Fluency (0/1) 0  Abstraction (0/2) 0  Delayed Recall (0/5) 1  Orientation (0/6) 5    Objective:     PHYSICAL EXAMINATION:  VITALS:   Vitals:   07/20/22 1423 07/20/22 1451  BP: (!) 150/82 (!) 140/88  Pulse: 64   Resp: 20   SpO2: 98%   Weight: 119 lb (54 kg)   Height: 5\' 6"  (1.676 m)     GEN:  The patient appears stated age and is in NAD. HEENT:  Normocephalic, atraumatic.   Neurological examination:  General: NAD, well-groomed, appears stated age. Orientation: The patient is alert. Oriented to person, place and not to date Cranial nerves: There is good facial symmetry.The speech is fluent and clear. No aphasia or dysarthria. Fund of knowledge is appropriate. Recent and remote memory are impaired. Attention and concentration are reduced.  Able to name objects and repeat phrases.  Hearing is intact to conversational tone.  Sensation: Sensation is intact to light touch throughout Motor: Strength is at least antigravity x4. DTR's 2/4 in UE/LE     Movement examination: Tone: There is normal tone in the UE/LE Abnormal  movements:  no tremor.  No myoclonus.  No asterixis.   Coordination:  There is no decremation with RAM's. Normal finger to nose  Gait and Station: The patient has no difficulty arising out of a deep-seated chair without the use of the hands. The patient's stride length is good.  Gait is cautious and narrow.    Thank you for allowing [place] the opportunity to participate in the care of this nice patient. Please do not hesitate to contact us for any questions or concerns.   Total time spent on today's visit was 22 minutes dedicated to this patient today, preparing to see patient, examining the patient, ordering tests and/or medications and counseling the patient, documenting clinical information in the EHR or other health record, independently interpreting results and communicating results to the patient/family, discussing treatment and goals, answering patient's questions and coordinating care.  Cc:  Irven Coe, MD  Marlowe Kays 07/20/2022 6:18 PM

## 2022-11-20 DIAGNOSIS — M810 Age-related osteoporosis without current pathological fracture: Secondary | ICD-10-CM | POA: Diagnosis not present

## 2022-11-20 DIAGNOSIS — I10 Essential (primary) hypertension: Secondary | ICD-10-CM | POA: Diagnosis not present

## 2022-11-20 DIAGNOSIS — E7800 Pure hypercholesterolemia, unspecified: Secondary | ICD-10-CM | POA: Diagnosis not present

## 2022-11-20 DIAGNOSIS — E039 Hypothyroidism, unspecified: Secondary | ICD-10-CM | POA: Diagnosis not present

## 2022-11-20 DIAGNOSIS — R319 Hematuria, unspecified: Secondary | ICD-10-CM | POA: Diagnosis not present

## 2022-11-21 ENCOUNTER — Other Ambulatory Visit: Payer: Self-pay | Admitting: Family Medicine

## 2022-11-21 DIAGNOSIS — Z122 Encounter for screening for malignant neoplasm of respiratory organs: Secondary | ICD-10-CM

## 2022-11-21 DIAGNOSIS — Z720 Tobacco use: Secondary | ICD-10-CM

## 2022-11-22 ENCOUNTER — Other Ambulatory Visit: Payer: Self-pay | Admitting: Family Medicine

## 2022-11-22 DIAGNOSIS — M810 Age-related osteoporosis without current pathological fracture: Secondary | ICD-10-CM

## 2022-12-19 DIAGNOSIS — R310 Gross hematuria: Secondary | ICD-10-CM | POA: Diagnosis not present

## 2023-01-01 ENCOUNTER — Ambulatory Visit
Admission: RE | Admit: 2023-01-01 | Discharge: 2023-01-01 | Disposition: A | Discharge status: Home / Self Care | Payer: Medicare HMO | Source: Ambulatory Visit | Attending: Family Medicine | Admitting: Family Medicine

## 2023-01-01 DIAGNOSIS — Z122 Encounter for screening for malignant neoplasm of respiratory organs: Secondary | ICD-10-CM

## 2023-01-01 DIAGNOSIS — F17210 Nicotine dependence, cigarettes, uncomplicated: Secondary | ICD-10-CM | POA: Diagnosis not present

## 2023-01-01 DIAGNOSIS — Z720 Tobacco use: Secondary | ICD-10-CM

## 2023-01-15 DIAGNOSIS — R310 Gross hematuria: Secondary | ICD-10-CM | POA: Diagnosis not present

## 2023-01-19 ENCOUNTER — Ambulatory Visit: Payer: Medicare HMO | Admitting: Physician Assistant

## 2023-01-19 ENCOUNTER — Encounter: Payer: Self-pay | Admitting: Physician Assistant

## 2023-01-19 VITALS — BP 152/71 | HR 69 | Resp 20 | Ht 66.0 in | Wt 112.0 lb

## 2023-01-19 DIAGNOSIS — F0280 Dementia in other diseases classified elsewhere without behavioral disturbance: Secondary | ICD-10-CM

## 2023-01-19 DIAGNOSIS — G309 Alzheimer's disease, unspecified: Secondary | ICD-10-CM | POA: Diagnosis not present

## 2023-01-19 DIAGNOSIS — R413 Other amnesia: Secondary | ICD-10-CM

## 2023-01-19 MED ORDER — DONEPEZIL HCL 10 MG PO TABS: ORAL_TABLET | ORAL | 3 refills | Status: AC

## 2023-01-19 MED ORDER — MEMANTINE HCL 5 MG PO TABS
ORAL_TABLET | ORAL | 11 refills | Status: AC
Start: 2023-01-19 — End: ?

## 2023-01-19 NOTE — Patient Instructions (Signed)
Continue Donepezil 10mg  daily Start Memantine 5mg  tablets.  Take 1 tablet at bedtime for 2 weeks, then 1 tablet twice daily.     3. Follow-up in 6 months, call for any changes   FALL PRECAUTIONS: Be cautious when walking. Scan the area for obstacles that may increase the risk of trips and falls. When getting up in the mornings, sit up at the edge of the bed for a few minutes before getting out of bed. Consider elevating the bed at the head end to avoid drop of blood pressure when getting up. Walk always in a well-lit room (use night lights in the walls). Avoid area rugs or power cords from appliances in the middle of the walkways. Use a walker or a cane if necessary and consider physical therapy for balance exercise. Get your eyesight checked regularly.  HOME SAFETY: Consider the safety of the kitchen when operating appliances like stoves, microwave oven, and blender. Consider having supervision and share cooking responsibilities until no longer able to participate in those. Accidents with firearms and other hazards in the house should be identified and addressed as well.  DRIVING: Regarding driving, in patients with progressive memory problems, driving will be impaired. We advise to have someone else do the driving if trouble finding directions or if minor accidents are reported. Independent driving assessment is available to determine safety of driving.  ABILITY TO BE LEFT ALONE: If patient is unable to contact 911 operator, consider using LifeLine, or when the need is there, arrange for someone to stay with patients. Smoking is a fire hazard, consider supervision or cessation. Risk of wandering should be assessed by caregiver and if detected at any point, supervision and safe proof recommendations should be instituted.  MEDICATION SUPERVISION: Inability to self-administer medication needs to be constantly addressed. Implement a mechanism to ensure safe administration of the  medications.  RECOMMENDATIONS FOR ALL PATIENTS WITH MEMORY PROBLEMS: 1. Continue to exercise (Recommend 30 minutes of walking everyday, or 3 hours every week) 2. Increase social interactions - continue going to [place] and enjoy social gatherings with friends and family 3. Eat healthy, avoid fried foods and eat more fruits and vegetables 4. Maintain adequate blood pressure, blood sugar, and blood cholesterol level. Reducing the risk of stroke and cardiovascular disease also helps promoting better memory. 5. Avoid stressful situations. Live a simple life and avoid aggravations. Organize your time and prepare for the next day in anticipation. 6. Sleep well, avoid any interruptions of sleep and avoid any distractions in the bedroom that may interfere with adequate sleep quality 7. Avoid sugar, avoid sweets as there is a strong link between excessive sugar intake, diabetes, and cognitive impairment We discussed the Mediterranean diet, which has been shown to help patients reduce the risk of progressive memory disorders and reduces cardiovascular risk. This includes eating fish, eat fruits and green leafy vegetables, nuts like almonds and hazelnuts, walnuts, and also use olive oil. Avoid fast foods and fried foods as much as possible. Avoid sweets and sugar as sugar use has been linked to worsening of memory function.  There is always a concern of gradual progression of memory problems. If this is the case, then we may need to adjust level of care according to patient needs. Support, both to the patient and caregiver, should then be put into place.       Mediterranean Diet  Why follow it? Research shows. Those who follow the Mediterranean diet have a reduced risk of heart disease  The diet  is associated with a reduced incidence of Parkinson's and Alzheimer's diseases People following the diet may have longer life expectancies and lower rates of chronic diseases  The Dietary Guidelines for Americans  recommends the Mediterranean diet as an eating plan to promote health and prevent disease  What Is the Mediterranean Diet?  Healthy eating plan based on typical foods and recipes of Mediterranean-style cooking The diet is primarily a plant based diet; these foods should make up a majority of meals   Starches - Plant based foods should make up a majority of meals - They are an important sources of vitamins, minerals, energy, antioxidants, and fiber - Choose whole grains, foods high in fiber and minimally processed items  - Typical grain sources include wheat, oats, barley, corn, brown rice, bulgar, farro, millet, polenta, couscous  - Various types of beans include chickpeas, lentils, fava beans, black beans, white beans   Fruits  Veggies - Large quantities of antioxidant rich fruits & veggies; 6 or more servings  - Vegetables can be eaten raw or lightly drizzled with oil and cooked  - Vegetables common to the traditional Mediterranean Diet include: artichokes, arugula, beets, broccoli, brussel sprouts, cabbage, carrots, celery, collard greens, cucumbers, eggplant, kale, leeks, lemons, lettuce, mushrooms, okra, onions, peas, peppers, potatoes, pumpkin, radishes, rutabaga, shallots, spinach, sweet potatoes, turnips, zucchini - Fruits common to the Mediterranean Diet include: apples, apricots, avocados, cherries, clementines, dates, figs, grapefruits, grapes, melons, nectarines, oranges, peaches, pears, pomegranates, strawberries, tangerines  Fats - Replace butter and margarine with healthy oils, such as olive oil, canola oil, and tahini  - Limit nuts to no more than a handful a day  - Nuts include walnuts, almonds, pecans, pistachios, pine nuts  - Limit or avoid candied, honey roasted or heavily salted nuts - Olives are central to the [employer] - can be eaten whole or used in a variety of dishes   Meats Protein - Limiting red meat: no more than a few times a month - When eating red meat:  choose lean cuts and keep the portion to the size of deck of cards - Eggs: approx. 0 to 4 times a week  - Fish and lean poultry: at least 2 a week  - Healthy protein sources include, chicken, [place], lean beef, lamb - Increase intake of seafood such as tuna, salmon, trout, mackerel, shrimp, scallops - Avoid or limit high fat processed meats such as sausage and bacon  Dairy - Include moderate amounts of low fat dairy products  - Focus on healthy dairy such as fat free yogurt, skim milk, low or reduced fat cheese - Limit dairy products higher in fat such as whole or 2% milk, cheese, ice cream  Alcohol - Moderate amounts of red wine is ok  - No more than 5 oz daily for women (all ages) and men older than age 22  - No more than 10 oz of wine daily for men younger than 53  Other - Limit sweets and other desserts  - Use herbs and spices instead of salt to flavor foods  - Herbs and spices common to the traditional Mediterranean Diet include: basil, bay leaves, chives, cloves, cumin, fennel, garlic, lavender, marjoram, mint, oregano, parsley, pepper, rosemary, sage, savory, sumac, tarragon, thyme   It's not just a diet, it's a lifestyle:  The Mediterranean diet includes lifestyle factors typical of those in the region  Foods, drinks and meals are best eaten with others and savored Daily physical activity is important for overall  good health This could be strenuous exercise like running and aerobics This could also be more leisurely activities such as walking, housework, yard-work, or taking the stairs Moderation is the key; a balanced and healthy diet accommodates most foods and drinks Consider portion sizes and frequency of consumption of certain foods   Meal Ideas & Options:  Breakfast:  Whole wheat toast or whole wheat English muffins with peanut butter & hard boiled egg Steel cut oats topped with apples & cinnamon and skim milk  Fresh fruit: banana, strawberries, melon, berries, peaches   Smoothies: strawberries, bananas, greek yogurt, peanut butter Low fat greek yogurt with blueberries and granola  Egg white omelet with spinach and mushrooms Breakfast couscous: whole wheat couscous, apricots, skim milk, cranberries  Sandwiches:  Hummus and grilled vegetables (peppers, zucchini, squash) on whole wheat bread   Grilled chicken on whole wheat pita with lettuce, tomatoes, cucumbers or tzatziki  [place] salad on whole wheat bread: tuna salad made with greek yogurt, olives, red peppers, capers, green onions Garlic rosemary lamb pita: lamb sauted with garlic, rosemary, salt & pepper; add lettuce, cucumber, greek yogurt to pita - flavor with lemon juice and black pepper  Seafood:  Mediterranean grilled salmon, seasoned with garlic, basil, parsley, lemon juice and black pepper Shrimp, lemon, and spinach whole-grain pasta salad made with low fat greek yogurt  Seared scallops with lemon orzo  Seared tuna steaks seasoned salt, pepper, coriander topped with tomato mixture of olives, tomatoes, olive oil, minced garlic, parsley, green onions and cappers  Meats:  Herbed greek chicken salad with kalamata olives, cucumber, feta  Red bell peppers stuffed with spinach, bulgur, lean ground beef (or lentils) & topped with feta   Kebabs: skewers of chicken, tomatoes, onions, zucchini, squash  [place] burgers: made with red onions, mint, dill, lemon juice, feta cheese topped with roasted red peppers Vegetarian Cucumber salad: cucumbers, artichoke hearts, celery, red onion, feta cheese, tossed in olive oil & lemon juice  Hummus and whole grain pita points with a greek salad (lettuce, tomato, feta, olives, cucumbers, red onion) Lentil soup with celery, carrots made with vegetable broth, garlic, salt and pepper  Tabouli salad: parsley, bulgur, mint, scallions, cucumbers, tomato, radishes, lemon juice, olive oil, salt and pepper.

## 2023-01-19 NOTE — Progress Notes (Signed)
Assessment/Plan:   Mild Cognitive Impairment likely due to Alzheimer's disease.   Beth Blake is a very pleasant 72 y.o. 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 Cognitive decline is noted in today's visit.  Patient is currently on donepezil 10 mg daily.  Discussed with her husband adding memantine to the regimen, he agrees to proceed.  Mood is good.  She is able to participate to her ADLs without significant difficulties.    Follow up in 6 months. Continue donepezil 10 mg daily, side effects discussed Will start memantine 5 mg twice daily as directed, side effects discussed.  If tolerated, will consider increasing to 10 mg. Recommend good control of her cardiovascular risk factors Continue to control mood as per PCP     Subjective:    This patient is accompanied in the office by her husband who supplements the history.  Previous records as well as any outside records available were reviewed prior to todays visit. Patient was last seen on 07/20/2022 with MMSE 15/30   Any changes in memory since last visit? " Not quite as good as before, a little worse "-her husband reports.  She has some difficulty with conversations and may forget people's names that she just meets more frequently.  She likes to watch Beth Blake shows action movies.  She no longer does any brain games. repeats oneself?  Endorsed, quite frequently during this visit. Disoriented when walking into a room?  Sometimes she does not know why she is in a room.  "Sometimes she goes to the kitchen to get a glass of water, and then comes back to the bedroom without the water because she forgot "-her husband says Leaving objects?  May misplace things but not in unusual places   Wandering  behavior?  Denies.   Any personality changes since last visit?  As before, she may have moments of irritability. Any worsening depression?:  Denies.   Hallucinations or paranoia?  Denies.   Seizures? denies    Any sleep changes?  She sleeps well. Denies vivid dreams, REM behavior or sleepwalking   Sleep apnea?   Denies.   Any hygiene concerns? Denies.  Independent of bathing and dressing?  Endorsed  Does the patient needs help with medications?  Husband  is in charge   Who is in charge of the finances?  Husband is in charge     Any changes in appetite?  denies     Patient have trouble swallowing? Denies.   Does the patient cook? No Any headaches?   denies   Chronic back pain  denies   Ambulates with difficulty? Denies.    Recent falls or head injuries? denies     Unilateral weakness, numbness or tingling? denies   Any tremors?  Denies   Any anosmia?  Denies   Any incontinence of urine?  "Sometimes ", not often Any bowel dysfunction?   Denies      Patient lives with her husband  Does the patient drive? No longer drives       History on Initial Assessment 01/21/2020: This is a 72 year old right-handed woman with a history of hypertension, hyperlipidemia, hypothyroidism presenting for evaluation of memory loss. Her husband is present to provide additional information. She feels her memory is "kinda half and half, sometimes  I can't remember nothing." She started noticing changes a couple of months ago, her husband noticed changes 10-12 months ago. She would forget what she went to a room for. She repeats herself several times. She got lost driving one time to pick up her husband from surgery last March. Her husband feels she would not find her car in a big parking lot such as [employer], but does okay with smaller ones. She has left the burner on sometimes. She manages her own medications without issues, her husband agrees. He took over finances in April, she would get confused on what to do and  where she is, she was not missing any payments. She has occasional word-finding difficulties. She saw her PCP in July 2021 with an MMSE of 20/30.  Her father had Alzheimer's disease. No history of significant head injuries or alcohol use.   She denies any headaches, dizziness, diplopia, dysarthria, dysphagia, back pain, focal numbness/tingling/weakness (she has occasional numbness in either hand or foot), bowel/bladder dysfunction. No anosmia, tremors, no falls. She has neck pain. Sleep is good. Mood is good most of the time. Her husband denies any personality changes, no paranoia or hallucinations.    Neurocognitive testing 03/16/21 Briefly, results suggested severe impairment surrounding all aspects of learning and memory. Additional impairment was exhibited across processing speed, attention/concentration, executive functioning, and receptive language. Performance variability was noted across semantic fluency and visuospatial abilities. Regarding etiology, I have primary concerns surrounding Alzheimer's disease as the cause for ongoing impairment. Across memory tasks, Beth Blake was fully amnestic (i.e., 0% retention) across all three memory tasks. She also generally performed poorly across yes/no recognition trials, suggesting the presence of rapid forgetting and an evolving and already quite severe memory storage deficit. Both of these memory findings are hallmark characteristics of Alzheimer's disease. Despite intact confrontation naming, impairments in executive functioning and variability in semantic fluency and visuospatial abilities would represent otherwise fairly typical disease progression. Admittedly, impairment in processing speed and attention/concentration is somewhat more advanced than what is typically seen in early to middle stages of Alzheimer's disease. However, this disease process remains the most likely cause at the present time.  PREVIOUS MEDICATIONS:   CURRENT MEDICATIONS:  Outpatient  Encounter Medications as of 01/19/2023  Medication Sig   amLODipine (NORVASC) 10 MG tablet Take 10 mg by mouth daily. Take 1 tab daily   atenolol (TENORMIN) 25 MG tablet Take by mouth daily.   hyoscyamine (LEVSIN/SL) 0.125 MG SL tablet Use one under tongue every 4-6 hours prn RUQ pain prn   levothyroxine (SYNTHROID, LEVOTHROID) 125 MCG tablet Take 125 mcg by mouth daily before breakfast. Take 1 tab daily   memantine (NAMENDA) 5 MG tablet Take 1 tablet (5 mg at night) for 2 weeks, then increase to 1 tablet (5 mg) twice a day   Multiple Vitamin (MULTIVITAMIN) tablet Take 1 tablet by mouth daily.   simvastatin (ZOCOR) 20 MG tablet Take 20 mg by mouth daily.   [DISCONTINUED] donepezil (ARICEPT) 10 MG tablet Take 1 tablet daily   donepezil (ARICEPT) 10 MG tablet Take 1 tablet daily   No facility-administered encounter medications on file as of 01/19/2023.       01/19/2023    1:00 PM 07/20/2022    6:00 PM 12/09/2021    3:00 PM  MMSE - Mini Mental State Exam  Orientation to time 0 0 0  Orientation to Place 3 4 5   Registration 3 3 3   Attention/ Calculation 0 0 0  Recall 0 0  1  Language- name 2 objects 2 2 2   Language- repeat 1 1 1   Language- follow 3 step command 3 3 3   Language- read & follow direction 1 1 1   Write a sentence 1 1 1   Copy design 0 0 0  Total score 14 15 17       01/21/2020    4:00 PM  Montreal Cognitive Assessment   Attention: Read list of digits (0/2) 1  Attention: Read list of letters (0/1) 1  Attention: Serial 7 subtraction starting at 100 (0/3) 0  Language: Repeat phrase (0/2) 0  Language : Fluency (0/1) 0  Abstraction (0/2) 0  Delayed Recall (0/5) 1  Orientation (0/6) 5    Objective:     PHYSICAL EXAMINATION:    VITALS:   Vitals:   01/19/23 1304 01/19/23 1327  BP: (!) 151/74 (!) 152/71  Pulse: 69   Resp: 20   SpO2: 98%   Weight: 112 lb (50.8 kg)   Height: 5\' 6"  (1.676 m)     GEN:  The patient appears stated age and is in NAD. HEENT:   Normocephalic, atraumatic.   Neurological examination:  General: NAD, well-groomed, appears stated age. Orientation: The patient is alert. Oriented to person, not to place or time Cranial nerves: There is good facial symmetry.The speech is fluent and clear. No aphasia or dysarthria. Fund of knowledge is reduced. Recent and remote memory are impaired. Attention and concentration are reduced.  Able to name objects and repeat phrases.  Hearing is intact to conversational tone.   Sensation: Sensation is intact to light touch throughout Motor: Strength is at least antigravity x4. DTR's 2/4 in UE/LE     Movement examination: Tone: There is normal tone in the UE/LE Abnormal movements:  no tremor.  No myoclonus.  No asterixis.   Coordination:  There is no decremation with RAM's. Normal finger to nose  Gait and Station: The patient has no  difficulty arising out of a deep-seated chair without the use of the hands. The patient's stride length is good.  Gait is cautious and narrow.    Thank you for allowing [place] the opportunity to participate in the care of this nice patient. Please do not hesitate to contact us for any questions or concerns.   Total time spent on today's visit was 30 minutes dedicated to this patient today, preparing to see patient, examining the patient, ordering tests and/or medications and counseling the patient, documenting clinical information in the EHR or other health record, independently interpreting results and communicating results to the patient/family, discussing treatment and goals, answering patient's questions and coordinating care.  Cc:  Irven Coe, MD  Marlowe Kays 01/19/2023 2:43 PM

## 2023-02-04 DIAGNOSIS — Z008 Encounter for other general examination: Secondary | ICD-10-CM | POA: Diagnosis not present

## 2023-02-12 DIAGNOSIS — N361 Urethral diverticulum: Secondary | ICD-10-CM | POA: Diagnosis not present

## 2023-02-12 DIAGNOSIS — R310 Gross hematuria: Secondary | ICD-10-CM | POA: Diagnosis not present

## 2023-05-22 ENCOUNTER — Encounter: Payer: Self-pay | Admitting: Family Medicine

## 2023-05-22 DIAGNOSIS — E039 Hypothyroidism, unspecified: Secondary | ICD-10-CM | POA: Diagnosis not present

## 2023-05-22 DIAGNOSIS — I10 Essential (primary) hypertension: Secondary | ICD-10-CM | POA: Diagnosis not present

## 2023-05-22 DIAGNOSIS — E7800 Pure hypercholesterolemia, unspecified: Secondary | ICD-10-CM | POA: Diagnosis not present

## 2023-05-22 DIAGNOSIS — M810 Age-related osteoporosis without current pathological fracture: Secondary | ICD-10-CM | POA: Diagnosis not present

## 2023-05-23 ENCOUNTER — Ambulatory Visit
Admission: RE | Admit: 2023-05-23 | Discharge: 2023-05-23 | Disposition: A | Discharge status: Home / Self Care | Source: Ambulatory Visit | Attending: Family Medicine | Admitting: Family Medicine

## 2023-05-23 ENCOUNTER — Other Ambulatory Visit: Payer: Self-pay | Admitting: Family Medicine

## 2023-05-23 DIAGNOSIS — R200 Anesthesia of skin: Secondary | ICD-10-CM | POA: Diagnosis not present

## 2023-05-23 DIAGNOSIS — M542 Cervicalgia: Secondary | ICD-10-CM

## 2023-05-25 ENCOUNTER — Encounter: Payer: Self-pay | Admitting: Physician Assistant

## 2023-06-07 DIAGNOSIS — K048 Radicular cyst: Secondary | ICD-10-CM | POA: Diagnosis not present

## 2023-07-11 DIAGNOSIS — E876 Hypokalemia: Secondary | ICD-10-CM | POA: Diagnosis not present

## 2023-07-11 DIAGNOSIS — E039 Hypothyroidism, unspecified: Secondary | ICD-10-CM | POA: Diagnosis not present

## 2023-07-16 ENCOUNTER — Encounter: Payer: Self-pay | Admitting: Physician Assistant

## 2023-07-16 ENCOUNTER — Ambulatory Visit: Admitting: Physician Assistant

## 2023-07-16 VITALS — BP 134/75 | Resp 20 | Ht 66.0 in | Wt 106.0 lb

## 2023-07-16 DIAGNOSIS — G309 Alzheimer's disease, unspecified: Secondary | ICD-10-CM | POA: Diagnosis not present

## 2023-07-16 DIAGNOSIS — F0280 Dementia in other diseases classified elsewhere without behavioral disturbance: Secondary | ICD-10-CM | POA: Diagnosis not present

## 2023-07-16 MED ORDER — MEMANTINE HCL 10 MG PO TABS: ORAL_TABLET | ORAL | 3 refills | Status: AC

## 2023-07-16 NOTE — Progress Notes (Signed)
 Assessment/Plan:   Dementia likely due to Alzheimer's disease   Beth Blake is a very pleasant 73 y.o. 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 Patient is currently on donepezil  10 mg daily and memantine  5 mg twice daily, tolerating well. Memory is fairly stable, although we discussed increasing dose of memantine  to 10 mg bid for better coverage, she agrees. She is able to participate on her ADLs without significant difficulties.  Her mood is good.   Follow up in  6 months. Continue donepezil  10 mg daily and increase memantine  to 10 mg twice daily. Side effects discussed  Recommend good control of her cardiovascular risk factors Continue to control mood as per PCP     Subjective:    This patient is accompanied in the office by her husband who supplements the history.  Previous records as well as any outside records available were reviewed prior to todays visit. Patient was last seen on 01/19/2023   Any changes in memory since last visit? A little worse.  She has slightly more difficulty with short-term memory including conversations, names of people that she just meets.  She likes to watch mystery shows an action movies, such as Westerns.  She no longer does brain stimulating exercises. repeats oneself?  Endorsed, very frequently during this visit. Disoriented when walking into a room?  In the orders, sometimes she does not know why she is in the room.  Leaving objects?  May misplace things but not in unusual places   Wandering behavior?  denies   Any personality changes since last visit?  She had some moments of irritability. Any worsening depression?:  Denies.   Hallucinations or paranoia?  Denies.   Seizures? denies    Any sleep changes?   Takes her a while, but then she can sleep the rest of the night. Denies vivid dreams, REM behavior or sleepwalking   Sleep apnea?   Denies.   Any hygiene concerns? Denies.  Independent of bathing and dressing?  Endorsed  Does the patient needs help with medications?  Husband is in charge   Who is in charge of the finances? Husband  is in charge     Any changes in appetite?  Denies.     Patient have trouble swallowing? Denies.   Does the patient cook? No Any headaches?   denies   Any vision changes? Chronic back pain  denies. She has neck pain today, believed to be musculoskeletal, seen her PCP, workup was negative.   Ambulates with difficulty? Denies.   Recent falls or head injuries? Denies.     Unilateral weakness, numbness or tingling? denies   Any tremors?  Denies   Any anosmia?  Denies   Any incontinence of urine?  Endorsed, intermittent.  Any bowel dysfunction?   Denies      Patient lives with her husband    Does the patient drive? No longer drives      History on Initial Assessment 01/21/2020: This is a 73 year old right-handed woman with a history of hypertension, hyperlipidemia, hypothyroidism presenting for evaluation of memory loss. Her husband is present to provide additional information. She feels her memory is kinda half and half, sometimes I can't remember nothing. She started noticing changes a couple of months ago, her husband noticed changes  10-12 months ago. She would forget what she went to a room for. She repeats herself several times. She got lost driving one time to pick up her husband from surgery last March. Her husband feels she would not find her car in a big parking lot such as [employer], but does okay with smaller ones. She has left the burner on sometimes. She manages her own medications without issues, her husband agrees. He took over finances in April, she would get confused on what to do and where she is, she was not missing any payments. She has occasional  word-finding difficulties. She saw her PCP in July 2021 with an MMSE of 20/30.  Her father had Alzheimer's disease. No history of significant head injuries or alcohol use.   She denies any headaches, dizziness, diplopia, dysarthria, dysphagia, back pain, focal numbness/tingling/weakness (she has occasional numbness in either hand or foot), bowel/bladder dysfunction. No anosmia, tremors, no falls. She has neck pain. Sleep is good. Mood is good most of the time. Her husband denies any personality changes, no paranoia or hallucinations.    Neurocognitive testing 03/16/21 Briefly, results suggested severe impairment surrounding all aspects of learning and memory. Additional impairment was exhibited across processing speed, attention/concentration, executive functioning, and receptive language. Performance variability was noted across semantic fluency and visuospatial abilities. Regarding etiology, I have primary concerns surrounding Alzheimer's disease as the cause for ongoing impairment. Across memory tasks, Ms. Fletes was fully amnestic (i.e., 0% retention) across all three memory tasks. She also generally performed poorly across yes/no recognition trials, suggesting the presence of rapid forgetting and an evolving and already quite severe memory storage deficit. Both of these memory findings are hallmark characteristics of Alzheimer's disease. Despite intact confrontation naming, impairments in executive functioning and variability in semantic fluency and visuospatial abilities would represent otherwise fairly typical disease progression. Admittedly, impairment in processing speed and attention/concentration is somewhat more advanced than what is typically seen in early to middle stages of Alzheimer's disease. However, this disease process remains the most likely cause at the present time.   PREVIOUS MEDICATIONS:   CURRENT MEDICATIONS:  Outpatient Encounter Medications as of 07/16/2023  Medication Sig    amLODipine (NORVASC) 10 MG tablet Take 10 mg by mouth daily. Take 1 tab daily   atenolol (TENORMIN) 25 MG tablet Take by mouth daily.   donepezil  (ARICEPT ) 10 MG tablet Take 1 tablet daily   hyoscyamine  (LEVSIN /SL) 0.125 MG SL tablet Use one under tongue every 4-6 hours prn RUQ pain prn   levothyroxine (SYNTHROID, LEVOTHROID) 125 MCG tablet Take 125 mcg by mouth daily before breakfast. Take 1 tab daily   Multiple Vitamin (MULTIVITAMIN) tablet Take 1 tablet by mouth daily.   simvastatin (ZOCOR) 20 MG tablet Take 20 mg by mouth daily.   [DISCONTINUED] memantine  (NAMENDA ) 5 MG tablet Take 1 tablet (5 mg at night) for 2 weeks, then increase to 1 tablet (5 mg) twice a day   memantine  (NAMENDA ) 10 MG tablet Take 1 tablet twice a day   No facility-administered encounter medications on file as of 07/16/2023.       01/19/2023    1:00 PM 07/20/2022    6:00 PM 12/09/2021    3:00 PM  MMSE - Mini Mental State Exam  Orientation to time 0 0 0  Orientation to Place 3 4 5   Registration 3 3 3   Attention/ Calculation 0 0 0  Recall 0 0 1  Language- name 2 objects 2 2 2   Language- repeat 1 1  1  Language- follow 3 step command 3 3 3   Language- read & follow direction 1 1 1   Write a sentence 1 1 1   Copy design 0 0 0  Total score 14 15 17       01/21/2020    4:00 PM  Montreal Cognitive Assessment   Attention: Read list of digits (0/2) 1  Attention: Read list of letters (0/1) 1  Attention: Serial 7 subtraction starting at 100 (0/3) 0  Language: Repeat phrase (0/2) 0  Language : Fluency (0/1) 0  Abstraction (0/2) 0  Delayed Recall (0/5) 1  Orientation (0/6) 5    Objective:     PHYSICAL EXAMINATION:    VITALS:   Vitals:   07/16/23 1303  BP: 134/75  Resp: 20  SpO2: 99%  Weight: 106 lb (48.1 kg)  Height: 5' 6 (1.676 m)    GEN:  The patient appears stated age and is in NAD. HEENT:  Normocephalic, atraumatic.   Neurological examination:  General: NAD, well-groomed, appears stated  age. Orientation: The patient is alert. Oriented to person, not to place and  not to date Cranial nerves: There is good facial symmetry.The speech is fluent and clear. No aphasia or dysarthria. Fund of knowledge is reduced. Recent and remote memory are impaired. Attention and concentration are reduced. Able to name objects and repeat phrases.  Hearing is intact to conversational tone.  Sensation: Sensation is intact to light touch throughout Motor: Strength is at least antigravity x4. DTR's 2/4 in UE/LE     Movement examination: Tone: There is normal tone in the UE/LE Abnormal movements:  no tremor.  No myoclonus.  No asterixis.   Coordination:  There is no decremation with RAM's. Normal finger to nose  Gait and Station: The patient has no difficulty arising out of a deep-seated chair without the use of the hands. The patient's stride length is good.  Gait is cautious and narrow.    Thank you for allowing us  the opportunity to participate in the care of this nice patient. Please do not hesitate to contact us  for any questions or concerns.   Total time spent on today's visit was 28 minutes dedicated to this patient today, preparing to see patient, examining the patient, ordering tests and/or medications and counseling the patient, documenting clinical information in the EHR or other health record, independently interpreting results and communicating results to the patient/family, discussing treatment and goals, answering patient's questions and coordinating care.  Cc:  Benedetto Brady, MD  Tex Filbert 07/16/2023 1:19 PM

## 2023-07-16 NOTE — Patient Instructions (Signed)
 Continue Donepezil  10mg  daily  Increase memantine  to 10 mg 1 tablet twice daily.     3. Follow-up in 6 months, call for any changes   FALL PRECAUTIONS: Be cautious when walking. Scan the area for obstacles that may increase the risk of trips and falls. When getting up in the mornings, sit up at the edge of the bed for a few minutes before getting out of bed. Consider elevating the bed at the head end to avoid drop of blood pressure when getting up. Walk always in a well-lit room (use night lights in the walls). Avoid area rugs or power cords from appliances in the middle of the walkways. Use a walker or a cane if necessary and consider physical therapy for balance exercise. Get your eyesight checked regularly.  HOME SAFETY: Consider the safety of the kitchen when operating appliances like stoves, microwave oven, and blender. Consider having supervision and share cooking responsibilities until no longer able to participate in those. Accidents with firearms and other hazards in the house should be identified and addressed as well.  DRIVING: Regarding driving, in patients with progressive memory problems, driving will be impaired. We advise to have someone else do the driving if trouble finding directions or if minor accidents are reported. Independent driving assessment is available to determine safety of driving.  ABILITY TO BE LEFT ALONE: If patient is unable to contact 911 operator, consider using LifeLine, or when the need is there, arrange for someone to stay with patients. Smoking is a fire hazard, consider supervision or cessation. Risk of wandering should be assessed by caregiver and if detected at any point, supervision and safe proof recommendations should be instituted.  MEDICATION SUPERVISION: Inability to self-administer medication needs to be constantly addressed. Implement a mechanism to ensure safe administration of the medications.  RECOMMENDATIONS FOR ALL PATIENTS WITH MEMORY  PROBLEMS: 1. Continue to exercise (Recommend 30 minutes of walking everyday, or 3 hours every week) 2. Increase social interactions - continue going to [place] and enjoy social gatherings with friends and family 3. Eat healthy, avoid fried foods and eat more fruits and vegetables 4. Maintain adequate blood pressure, blood sugar, and blood cholesterol level. Reducing the risk of stroke and cardiovascular disease also helps promoting better memory. 5. Avoid stressful situations. Live a simple life and avoid aggravations. Organize your time and prepare for the next day in anticipation. 6. Sleep well, avoid any interruptions of sleep and avoid any distractions in the bedroom that may interfere with adequate sleep quality 7. Avoid sugar, avoid sweets as there is a strong link between excessive sugar intake, diabetes, and cognitive impairment We discussed the Mediterranean diet, which has been shown to help patients reduce the risk of progressive memory disorders and reduces cardiovascular risk. This includes eating fish, eat fruits and green leafy vegetables, nuts like almonds and hazelnuts, walnuts, and also use olive oil. Avoid fast foods and fried foods as much as possible. Avoid sweets and sugar as sugar use has been linked to worsening of memory function.  There is always a concern of gradual progression of memory problems. If this is the case, then we may need to adjust level of care according to patient needs. Support, both to the patient and caregiver, should then be put into place.       Mediterranean Diet  Why follow it? Research shows. Those who follow the Mediterranean diet have a reduced risk of heart disease  The diet is associated with a reduced incidence of Parkinson's  and Alzheimer's diseases People following the diet may have longer life expectancies and lower rates of chronic diseases  The Dietary Guidelines for Americans recommends the Mediterranean diet as an eating plan to promote  health and prevent disease  What Is the Mediterranean Diet?  Healthy eating plan based on typical foods and recipes of Mediterranean-style cooking The diet is primarily a plant based diet; these foods should make up a majority of meals   Starches - Plant based foods should make up a majority of meals - They are an important sources of vitamins, minerals, energy, antioxidants, and fiber - Choose whole grains, foods high in fiber and minimally processed items  - Typical grain sources include wheat, oats, barley, corn, brown rice, bulgar, farro, millet, polenta, couscous  - Various types of beans include chickpeas, lentils, fava beans, black beans, white beans   Fruits  Veggies - Large quantities of antioxidant rich fruits & veggies; 6 or more servings  - Vegetables can be eaten raw or lightly drizzled with oil and cooked  - Vegetables common to the traditional Mediterranean Diet include: artichokes, arugula, beets, broccoli, brussel sprouts, cabbage, carrots, celery, collard greens, cucumbers, eggplant, kale, leeks, lemons, lettuce, mushrooms, okra, onions, peas, peppers, potatoes, pumpkin, radishes, rutabaga, shallots, spinach, sweet potatoes, turnips, zucchini - Fruits common to the Mediterranean Diet include: apples, apricots, avocados, cherries, clementines, dates, figs, grapefruits, grapes, melons, nectarines, oranges, peaches, pears, pomegranates, strawberries, tangerines  Fats - Replace butter and margarine with healthy oils, such as olive oil, canola oil, and tahini  - Limit nuts to no more than a handful a day  - Nuts include walnuts, almonds, pecans, pistachios, pine nuts  - Limit or avoid candied, honey roasted or heavily salted nuts - Olives are central to the [employer] - can be eaten whole or used in a variety of dishes   Meats Protein - Limiting red meat: no more than a few times a month - When eating red meat: choose lean cuts and keep the portion to the size of deck of  cards - Eggs: approx. 0 to 4 times a week  - Fish and lean poultry: at least 2 a week  - Healthy protein sources include, chicken, [place], lean beef, lamb - Increase intake of seafood such as tuna, salmon, trout, mackerel, shrimp, scallops - Avoid or limit high fat processed meats such as sausage and bacon  Dairy - Include moderate amounts of low fat dairy products  - Focus on healthy dairy such as fat free yogurt, skim milk, low or reduced fat cheese - Limit dairy products higher in fat such as whole or 2% milk, cheese, ice cream  Alcohol - Moderate amounts of red wine is ok  - No more than 5 oz daily for women (all ages) and men older than age 55  - No more than 10 oz of wine daily for men younger than 65  Other - Limit sweets and other desserts  - Use herbs and spices instead of salt to flavor foods  - Herbs and spices common to the traditional Mediterranean Diet include: basil, bay leaves, chives, cloves, cumin, fennel, garlic, lavender, marjoram, mint, oregano, parsley, pepper, rosemary, sage, savory, sumac, tarragon, thyme   It's not just a diet, it's a lifestyle:  The Mediterranean diet includes lifestyle factors typical of those in the region  Foods, drinks and meals are best eaten with others and savored Daily physical activity is important for overall good health This could be strenuous exercise like  running and aerobics This could also be more leisurely activities such as walking, housework, yard-work, or taking the stairs Moderation is the key; a balanced and healthy diet accommodates most foods and drinks Consider portion sizes and frequency of consumption of certain foods   Meal Ideas & Options:  Breakfast:  Whole wheat toast or whole wheat English muffins with peanut butter & hard boiled egg Steel cut oats topped with apples & cinnamon and skim milk  Fresh fruit: banana, strawberries, melon, berries, peaches  Smoothies: strawberries, bananas, greek yogurt, peanut  butter Low fat greek yogurt with blueberries and granola  Egg white omelet with spinach and mushrooms Breakfast couscous: whole wheat couscous, apricots, skim milk, cranberries  Sandwiches:  Hummus and grilled vegetables (peppers, zucchini, squash) on whole wheat bread   Grilled chicken on whole wheat pita with lettuce, tomatoes, cucumbers or tzatziki  [place] salad on whole wheat bread: tuna salad made with greek yogurt, olives, red peppers, capers, green onions Garlic rosemary lamb pita: lamb sauted with garlic, rosemary, salt & pepper; add lettuce, cucumber, greek yogurt to pita - flavor with lemon juice and black pepper  Seafood:  Mediterranean grilled salmon, seasoned with garlic, basil, parsley, lemon juice and black pepper Shrimp, lemon, and spinach whole-grain pasta salad made with low fat greek yogurt  Seared scallops with lemon orzo  Seared tuna steaks seasoned salt, pepper, coriander topped with tomato mixture of olives, tomatoes, olive oil, minced garlic, parsley, green onions and cappers  Meats:  Herbed greek chicken salad with kalamata olives, cucumber, feta  Red bell peppers stuffed with spinach, bulgur, lean ground beef (or lentils) & topped with feta   Kebabs: skewers of chicken, tomatoes, onions, zucchini, squash  [place] burgers: made with red onions, mint, dill, lemon juice, feta cheese topped with roasted red peppers Vegetarian Cucumber salad: cucumbers, artichoke hearts, celery, red onion, feta cheese, tossed in olive oil & lemon juice  Hummus and whole grain pita points with a greek salad (lettuce, tomato, feta, olives, cucumbers, red onion) Lentil soup with celery, carrots made with vegetable broth, garlic, salt and pepper  Tabouli salad: parsley, bulgur, mint, scallions, cucumbers, tomato, radishes, lemon juice, olive oil, salt and pepper.

## 2023-07-20 ENCOUNTER — Ambulatory Visit: Payer: Medicare HMO | Admitting: Physician Assistant

## 2023-08-29 ENCOUNTER — Ambulatory Visit: Admitting: Physician Assistant

## 2023-10-16 DIAGNOSIS — Z681 Body mass index (BMI) 19 or less, adult: Secondary | ICD-10-CM | POA: Diagnosis not present

## 2023-10-16 DIAGNOSIS — M542 Cervicalgia: Secondary | ICD-10-CM | POA: Diagnosis not present

## 2023-10-24 DIAGNOSIS — M542 Cervicalgia: Secondary | ICD-10-CM | POA: Diagnosis not present

## 2023-11-06 DIAGNOSIS — M542 Cervicalgia: Secondary | ICD-10-CM | POA: Diagnosis not present

## 2023-11-06 DIAGNOSIS — Z681 Body mass index (BMI) 19 or less, adult: Secondary | ICD-10-CM | POA: Diagnosis not present

## 2023-11-07 DIAGNOSIS — M542 Cervicalgia: Secondary | ICD-10-CM | POA: Diagnosis not present

## 2023-11-15 DIAGNOSIS — M5412 Radiculopathy, cervical region: Secondary | ICD-10-CM | POA: Diagnosis not present

## 2023-11-22 ENCOUNTER — Other Ambulatory Visit: Payer: Self-pay | Admitting: Family Medicine

## 2023-11-22 DIAGNOSIS — Z1231 Encounter for screening mammogram for malignant neoplasm of breast: Secondary | ICD-10-CM

## 2023-11-22 DIAGNOSIS — M542 Cervicalgia: Secondary | ICD-10-CM | POA: Diagnosis not present

## 2023-11-22 DIAGNOSIS — R7309 Other abnormal glucose: Secondary | ICD-10-CM | POA: Diagnosis not present

## 2023-11-22 DIAGNOSIS — E039 Hypothyroidism, unspecified: Secondary | ICD-10-CM | POA: Diagnosis not present

## 2023-11-22 DIAGNOSIS — E7800 Pure hypercholesterolemia, unspecified: Secondary | ICD-10-CM | POA: Diagnosis not present

## 2023-11-22 DIAGNOSIS — M810 Age-related osteoporosis without current pathological fracture: Secondary | ICD-10-CM | POA: Diagnosis not present

## 2023-11-22 DIAGNOSIS — Z1239 Encounter for other screening for malignant neoplasm of breast: Secondary | ICD-10-CM | POA: Diagnosis not present

## 2023-11-22 DIAGNOSIS — I10 Essential (primary) hypertension: Secondary | ICD-10-CM | POA: Diagnosis not present

## 2023-11-22 DIAGNOSIS — G309 Alzheimer's disease, unspecified: Secondary | ICD-10-CM | POA: Diagnosis not present

## 2023-11-22 DIAGNOSIS — Z720 Tobacco use: Secondary | ICD-10-CM | POA: Diagnosis not present

## 2023-11-22 DIAGNOSIS — Z0000 Encounter for general adult medical examination without abnormal findings: Secondary | ICD-10-CM | POA: Diagnosis not present

## 2023-11-22 DIAGNOSIS — Z23 Encounter for immunization: Secondary | ICD-10-CM | POA: Diagnosis not present

## 2023-11-22 DIAGNOSIS — I2510 Atherosclerotic heart disease of native coronary artery without angina pectoris: Secondary | ICD-10-CM | POA: Diagnosis not present

## 2023-11-29 ENCOUNTER — Ambulatory Visit
Admission: RE | Admit: 2023-11-29 | Discharge: 2023-11-29 | Disposition: A | Discharge status: Home / Self Care | Source: Ambulatory Visit | Attending: Family Medicine | Admitting: Family Medicine

## 2023-11-29 DIAGNOSIS — Z1231 Encounter for screening mammogram for malignant neoplasm of breast: Secondary | ICD-10-CM

## 2023-12-10 ENCOUNTER — Encounter: Payer: Self-pay | Admitting: Physician Assistant

## 2023-12-17 ENCOUNTER — Other Ambulatory Visit: Payer: Self-pay | Admitting: Family Medicine

## 2023-12-17 DIAGNOSIS — Z720 Tobacco use: Secondary | ICD-10-CM

## 2024-01-03 DIAGNOSIS — I10 Essential (primary) hypertension: Secondary | ICD-10-CM | POA: Diagnosis not present

## 2024-01-03 DIAGNOSIS — E039 Hypothyroidism, unspecified: Secondary | ICD-10-CM | POA: Diagnosis not present

## 2024-01-08 DIAGNOSIS — Z681 Body mass index (BMI) 19 or less, adult: Secondary | ICD-10-CM | POA: Diagnosis not present

## 2024-01-08 DIAGNOSIS — M5412 Radiculopathy, cervical region: Secondary | ICD-10-CM | POA: Diagnosis not present

## 2024-01-14 NOTE — Progress Notes (Incomplete)
 Dementia likely due to Alzheimer's disease    Beth Blake is a very pleasant 73 y.o. 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 Patient is on donepezil  10 mg daily and memantine  10 mg twice daily***. Patient was last seen on 07/16/2023***. Memory is ***. MMSE today is  /30.Patient is able to participate on ADLs and to to drive without difficulties. Mood is ***.  This patient is accompanied in the office by her husband***  who supplements the history. Previous records as well as any outside records available were reviewed prior to todays visit.     Follow-up in 6 months Continue donepezil  10 mg daily and memantine  10 mg twice daily, side effects discussed Continue to control mood as per PCP Recommend good control of cardiovascular risk factors [place] up in  months***     Discussed the use of AI scribe software for clinical note transcription with the patient, who gave verbal consent to proceed.  History of Present Illness   Any changes in memory since last visit? A little worse.  She has slightly more difficulty with short-term memory including conversations, names of people that she just meets.  She likes to watch mystery shows an action movies, such as Westerns.  She no longer does brain stimulating exercises. repeats oneself?  Endorsed, very frequently during this visit. Disoriented when walking into a room?  In the orders, sometimes she does not know why she is in the room.  Leaving objects?  May misplace things but not in unusual places   Wandering behavior?  denies   Any personality changes since last visit?  She had some moments of irritability. Any worsening depression?:  Denies.    Hallucinations or paranoia?  Denies.   Seizures? denies    Any sleep changes?  Takes her a while, but then she can sleep the rest of the night. Denies vivid dreams, REM behavior or sleepwalking   Sleep apnea?   Denies.   Any hygiene concerns? Denies.  Independent of bathing and dressing?  Endorsed  Does the patient needs help with medications?  Husband is in charge   Who is in charge of the finances? Husband  is in charge     Any changes in appetite?  Denies.     Patient have trouble swallowing? Denies.   Does the patient cook? No Any headaches?   denies   Any vision changes? Chronic back pain  denies. She has neck pain today, believed to be musculoskeletal, seen her PCP, workup was negative.   Ambulates with difficulty? Denies.   Recent falls or head injuries? Denies.     Unilateral weakness, numbness or tingling? denies   Any tremors?  Denies   Any anosmia?  Denies   Any incontinence of urine?  Endorsed, intermittent.  Any bowel dysfunction?   Denies      Patient lives with her husband    Does the patient drive? No longer drives      History on Initial Assessment 01/21/2020: This is a 73 year old right-handed woman with a history of hypertension, hyperlipidemia, hypothyroidism presenting for evaluation of memory loss. Her husband is present to provide additional information. She feels her memory is kinda half and half, sometimes I can't remember nothing. She  started noticing changes a couple of months ago, her husband noticed changes 10-12 months ago. She would forget what she went to a room for. She repeats herself several times. She got lost driving one time to pick up her husband from surgery last March. Her husband feels she would not find her car in a big parking lot such as [employer], but does okay with smaller ones. She has left the burner on sometimes. She manages her own medications without issues, her husband agrees. He took over finances in April, she would get confused on  what to do and where she is, she was not missing any payments. She has occasional word-finding difficulties. She saw her PCP in July 2021 with an MMSE of 20/30.  Her father had Alzheimer's disease. No history of significant head injuries or alcohol use.   She denies any headaches, dizziness, diplopia, dysarthria, dysphagia, back pain, focal numbness/tingling/weakness (she has occasional numbness in either hand or foot), bowel/bladder dysfunction. No anosmia, tremors, no falls. She has neck pain. Sleep is good. Mood is good most of the time. Her husband denies any personality changes, no paranoia or hallucinations.    Neurocognitive testing 03/16/21 Briefly, results suggested severe impairment surrounding all aspects of learning and memory. Additional impairment was exhibited across processing speed, attention/concentration, executive functioning, and receptive language. Performance variability was noted across semantic fluency and visuospatial abilities. Regarding etiology, I have primary concerns surrounding Alzheimer's disease as the cause for ongoing impairment. Across memory tasks, [name] was fully amnestic (i.e., 0% retention) across all three memory tasks. She also generally performed poorly across yes/no recognition trials, suggesting the presence of rapid forgetting and an evolving and already quite severe memory storage deficit. Both of these memory findings are hallmark characteristics of Alzheimer's disease. Despite intact confrontation naming, impairments in executive functioning and variability in semantic fluency and visuospatial abilities would represent otherwise fairly typical disease progression. Admittedly, impairment in processing speed and attention/concentration is somewhat more advanced than what is typically seen in early to middle stages of Alzheimer's disease. However, this disease process remains the most likely cause at the present time.       Past Medical History:  Diagnosis  Date   Acute pain of left knee 12/20/2015   Cervical spondylosis 03/25/2012   MRI 05/2006 IMPRESSION: acute increased pain with abnormal imaging study   Closed fracture of dorsal (thoracic) vertebra 02/12/2012   bone density normal 2013   Dementia due to Alzheimer's disease 03/09/2021   Early satiety 04/30/2014   Essential hypertension 02/12/2012   The goal for blood pressure is less than 140/90.  If you are checking your blood pressure at home, please record it and bring it to your next office visit. Following the Dietary Approaches to Stop Hypertension (DASH) diet (3 servings of fruit and vegetables daily, whole grains, low sodium, low-fat proteins) and exerci   Fibromyalgia    History of pancreatitis 04/19/2015   03/2010 at [facility] hospital   Hyperlipemia    Hyperlipidemia 08/07/2011   Lipid abnormalities are unchanged. Continue statin, healthy diet and exercise also advised. Lipids will be reassessed in 1 year   Hypothyroidism    Mitral valve prolapse    Myalgia 04/29/2010   Osteopenia 12/08/2014   Bone density test October 2016   Pain in thoracic spine 04/29/2010   Tobacco abuse    Vitamin D  deficiency 08/14/2013   Weight loss      Past Surgical History:  Procedure Laterality Date   ABDOMINAL HYSTERECTOMY  LUMBAR DISC SURGERY     x 5         Objective:     PHYSICAL EXAMINATION:    VITALS:  There were no vitals filed for this visit.  GEN:  The patient appears stated age and is in NAD. HEENT:  Normocephalic, atraumatic.   Neurological examination:  General: NAD, well-groomed, appears stated age. Orientation: The patient is alert. Oriented to person, place and not to date.*** Cranial nerves: There is good facial symmetry.The speech is fluent and clear. No aphasia or dysarthria. Fund of knowledge is reduced. Recent and remote memory impaired.  Attention and concentration are brief used.  Able to name objects and repeat phrases.  Hearing is intact to conversational tone  ***.   Delayed recall *** Sensation: Sensation is intact to light touch throughout Motor: Strength is at least antigravity x4. DTR's 2/4 in UE/LE      01/21/2020    4:00 PM  Montreal Cognitive Assessment   Attention: Read list of digits (0/2) 1  Attention: Read list of letters (0/1) 1  Attention: Serial 7 subtraction starting at 100 (0/3) 0  Language: Repeat phrase (0/2) 0  Language : Fluency (0/1) 0  Abstraction (0/2) 0  Delayed Recall (0/5) 1  Orientation (0/6) 5       01/19/2023    1:00 PM 07/20/2022    6:00 PM 12/09/2021    3:00 PM  MMSE - Mini Mental State Exam  Orientation to time 0 0 0  Orientation to Place 3 4 5   Registration 3 3 3   Attention/ Calculation 0 0 0  Recall 0 0 1  Language- name 2 objects 2 2 2   Language- repeat 1 1 1   Language- follow 3 step command 3 3 3   Language- read & follow direction 1 1 1   Write a sentence 1 1 1   Copy design 0 0 0  Total score 14 15 17       Movement examination: Tone: There is normal tone in the UE/LE Abnormal movements:  no tremor.  No myoclonus.  No asterixis.   Coordination:  There is no decremation with RAM's. Normal finger to nose  Gait and Station: The patient has no difficulty arising out of a deep-seated chair without the use of the hands. The patient's stride length is good.  Gait is cautious and narrow.   Thank you for allowing us  the opportunity to participate in the care of this nice patient. Please do not hesitate to contact us  for any questions or concerns.   Total time spent on today's visit was *** minutes dedicated to this patient today, preparing to see patient, examining the patient, ordering tests and/or medications and counseling the patient, documenting clinical information in the EHR or other health record, independently interpreting results and communicating results to the patient/family, discussing treatment and goals, answering patient's questions and coordinating care.  Cc:  Leonel Cole, MD  Camie Sevin 01/14/2024 6:37 AM

## 2024-01-15 ENCOUNTER — Encounter: Payer: Self-pay | Admitting: Physician Assistant

## 2024-01-15 ENCOUNTER — Ambulatory Visit: Admitting: Physician Assistant
